# Patient Record
Sex: Female | Born: 1965
Health system: Southern US, Community
[De-identification: ages and names within clinical notes are randomized; demographics above are authoritative.]

## PROBLEM LIST (undated history)

## (undated) DIAGNOSIS — G43909 Migraine, unspecified, not intractable, without status migrainosus: Secondary | ICD-10-CM

## (undated) DIAGNOSIS — I10 Essential (primary) hypertension: Secondary | ICD-10-CM

## (undated) DIAGNOSIS — R61 Generalized hyperhidrosis: Secondary | ICD-10-CM

## (undated) DIAGNOSIS — K219 Gastro-esophageal reflux disease without esophagitis: Secondary | ICD-10-CM

## (undated) DIAGNOSIS — E785 Hyperlipidemia, unspecified: Secondary | ICD-10-CM

## (undated) DIAGNOSIS — F3281 Premenstrual dysphoric disorder: Secondary | ICD-10-CM

## (undated) DIAGNOSIS — N390 Urinary tract infection, site not specified: Secondary | ICD-10-CM

## (undated) HISTORY — DX: Urinary tract infection, site not specified: N39.0

## (undated) HISTORY — DX: Premenstrual dysphoric disorder: F32.81

## (undated) HISTORY — DX: Hyperlipidemia, unspecified: E78.5

## (undated) HISTORY — DX: Generalized hyperhidrosis: R61

## (undated) HISTORY — DX: Migraine, unspecified, not intractable, without status migrainosus: G43.909

## (undated) HISTORY — DX: Essential (primary) hypertension: I10

## (undated) HISTORY — DX: Gastro-esophageal reflux disease without esophagitis: K21.9

---

## 2002-02-13 HISTORY — PX: TUBAL LIGATION: SHX77

## 2013-10-09 LAB — LIPID PANEL
Cholesterol: 193 mg/dL (ref 0–200)
HDL: 56 mg/dL (ref 35–70)
LDL Cholesterol: 111 mg/dL
Triglycerides: 130 mg/dL (ref 40–160)

## 2013-10-09 LAB — TSH: TSH: 1.83 u[IU]/mL (ref <=5.90)

## 2013-11-12 LAB — HM PAP SMEAR: HM Pap smear: NEGATIVE

## 2014-07-21 ENCOUNTER — Other Ambulatory Visit: Payer: Self-pay | Admitting: Obstetrics and Gynecology

## 2014-07-21 ENCOUNTER — Other Ambulatory Visit (INDEPENDENT_AMBULATORY_CARE_PROVIDER_SITE_OTHER): Payer: BLUE CROSS/BLUE SHIELD | Admitting: *Deleted

## 2014-07-21 VITALS — BP 129/77 | HR 75 | Ht 60.0 in | Wt 143.4 lb

## 2014-07-21 DIAGNOSIS — R319 Hematuria, unspecified: Secondary | ICD-10-CM

## 2014-07-21 DIAGNOSIS — N390 Urinary tract infection, site not specified: Secondary | ICD-10-CM

## 2014-07-21 LAB — POCT URINALYSIS DIPSTICK
Bilirubin, UA: NEGATIVE
Glucose, UA: NEGATIVE
Ketones, UA: NEGATIVE
Nitrite, UA: POSITIVE
Protein, UA: NEGATIVE
Spec Grav, UA: <=1.005
Urobilinogen, UA: 0.2
pH, UA: 6.5

## 2014-07-21 MED ORDER — UROGESIC-BLUE 81.6 MG PO TABS: 1.0000 | ORAL_TABLET | Freq: Four times a day (QID) | ORAL | Status: DC | PRN

## 2014-07-21 MED ORDER — NITROFURANTOIN MONOHYD MACRO 100 MG PO CAPS: 100.0000 mg | ORAL_CAPSULE | Freq: Two times a day (BID) | ORAL | Status: DC

## 2014-07-23 LAB — URINE CULTURE

## 2014-07-23 NOTE — Progress Notes (Signed)
Quick Note:  Please let her know her urine grew E coli- to finish all antibiotics and let us know if she isn't feeling better ______

## 2014-11-11 ENCOUNTER — Encounter: Payer: Self-pay | Admitting: *Deleted

## 2014-11-17 ENCOUNTER — Encounter: Payer: BLUE CROSS/BLUE SHIELD | Admitting: Obstetrics and Gynecology

## 2014-12-07 ENCOUNTER — Encounter (HOSPITAL_COMMUNITY): Payer: Self-pay | Admitting: Cardiology

## 2014-12-07 ENCOUNTER — Emergency Department (HOSPITAL_COMMUNITY): Payer: BLUE CROSS/BLUE SHIELD

## 2014-12-07 ENCOUNTER — Emergency Department (HOSPITAL_COMMUNITY)
Admission: EM | Admit: 2014-12-07 | Discharge: 2014-12-07 | Disposition: A | Discharge status: Home / Self Care | Payer: BLUE CROSS/BLUE SHIELD | Attending: Emergency Medicine | Admitting: Emergency Medicine

## 2014-12-07 DIAGNOSIS — Z3202 Encounter for pregnancy test, result negative: Secondary | ICD-10-CM | POA: Diagnosis not present

## 2014-12-07 DIAGNOSIS — R109 Unspecified abdominal pain: Secondary | ICD-10-CM

## 2014-12-07 DIAGNOSIS — N200 Calculus of kidney: Secondary | ICD-10-CM

## 2014-12-07 DIAGNOSIS — Z87440 Personal history of urinary (tract) infections: Secondary | ICD-10-CM | POA: Diagnosis not present

## 2014-12-07 DIAGNOSIS — Z8659 Personal history of other mental and behavioral disorders: Secondary | ICD-10-CM | POA: Diagnosis not present

## 2014-12-07 LAB — URINE MICROSCOPIC-ADD ON

## 2014-12-07 LAB — CBC
HCT: 40.9 % (ref 36.0–46.0)
Hemoglobin: 13.4 g/dL (ref 12.0–15.0)
MCH: 30.2 pg (ref 26.0–34.0)
MCHC: 32.8 g/dL (ref 30.0–36.0)
MCV: 92.1 fL (ref 78.0–100.0)
Platelets: 314 10*3/uL (ref 150–400)
RBC: 4.44 MIL/uL (ref 3.87–5.11)
RDW: 12.6 % (ref 11.5–15.5)
WBC: 7.4 10*3/uL (ref 4.0–10.5)

## 2014-12-07 LAB — COMPREHENSIVE METABOLIC PANEL
ALT: 16 U/L (ref 14–54)
AST: 16 U/L (ref 15–41)
Albumin: 4 g/dL (ref 3.5–5.0)
Alkaline Phosphatase: 46 U/L (ref 38–126)
Anion gap: 7 (ref 5–15)
BUN: 9 mg/dL (ref 6–20)
CO2: 25 mmol/L (ref 22–32)
Calcium: 9.6 mg/dL (ref 8.9–10.3)
Chloride: 108 mmol/L (ref 101–111)
Creatinine, Ser: 0.73 mg/dL (ref 0.44–1.00)
GFR calc Af Amer: >60 mL/min (ref >60)
GFR calc non Af Amer: >60 mL/min (ref >60)
Glucose, Bld: 117 mg/dL — ABNORMAL HIGH (ref 65–99)
Potassium: 4.1 mmol/L (ref 3.5–5.1)
Sodium: 140 mmol/L (ref 135–145)
Total Bilirubin: 0.6 mg/dL (ref 0.3–1.2)
Total Protein: 6.8 g/dL (ref 6.5–8.1)

## 2014-12-07 LAB — URINALYSIS, ROUTINE W REFLEX MICROSCOPIC
Bilirubin Urine: NEGATIVE
Glucose, UA: NEGATIVE mg/dL
Ketones, ur: NEGATIVE mg/dL
Leukocytes, UA: NEGATIVE
Nitrite: NEGATIVE
Protein, ur: NEGATIVE mg/dL
Specific Gravity, Urine: 1.021 (ref 1.005–1.030)
Urobilinogen, UA: 0.2 mg/dL (ref 0.0–1.0)
pH: 7 (ref 5.0–8.0)

## 2014-12-07 LAB — POC URINE PREG, ED: Preg Test, Ur: NEGATIVE

## 2014-12-07 LAB — LIPASE, BLOOD: Lipase: 20 U/L (ref 11–51)

## 2014-12-07 MED ORDER — ONDANSETRON 4 MG PO TBDP
4.0000 mg | ORAL_TABLET | Freq: Once | ORAL | Status: AC
  Administered 2014-12-07: 4 mg via ORAL
  Filled 2014-12-07: qty 1

## 2014-12-07 MED ORDER — ONDANSETRON 4 MG PO TBDP: 4.0000 mg | ORAL_TABLET | Freq: Three times a day (TID) | ORAL | Status: DC | PRN

## 2014-12-07 MED ORDER — OXYCODONE-ACETAMINOPHEN 5-325 MG PO TABS: 2.0000 | ORAL_TABLET | Freq: Once | ORAL | Status: DC

## 2014-12-07 MED ORDER — IBUPROFEN 800 MG PO TABS: 800.0000 mg | ORAL_TABLET | Freq: Three times a day (TID) | ORAL | Status: DC

## 2014-12-07 MED ORDER — KETOROLAC TROMETHAMINE 30 MG/ML IJ SOLN
30.0000 mg | Freq: Once | INTRAMUSCULAR | Status: AC
  Administered 2014-12-07: 30 mg via INTRAVENOUS
  Filled 2014-12-07: qty 1

## 2014-12-07 NOTE — Discharge Instructions (Signed)
1. Medications: ibuprofen, zofran, usual home medications 2. Treatment: rest, drink plenty of fluids 3. Follow Up: please followup with your primary doctor this week for discussion of your diagnoses and further evaluation after today's visit; if you do not have a primary care doctor use the resource guide provided to find one; please return to the ER for high fever, severe pain, persistent vomiting, new or worsening symptoms   Flank Pain Flank pain refers to pain that is located on the side of the body between the upper abdomen and the back. The pain may occur over a short period of time (acute) or may be long-term or reoccurring (chronic). It may be mild or severe. Flank pain can be caused by many things. CAUSES  Some of the more common causes of flank pain include:  Muscle strains.   Muscle spasms.   A disease of your spine (vertebral disk disease).   A lung infection (pneumonia).   Fluid around your lungs (pulmonary edema).   A kidney infection.   Kidney stones.   A very painful skin rash caused by the chickenpox virus (shingles).   Gallbladder disease.  [place] care will depend on the cause of your pain. In general,  Rest as directed by your caregiver.  Drink enough fluids to keep your urine clear or pale yellow.  Only take over-the-counter or prescription medicines as directed by your caregiver. Some medicines may help relieve the pain.  Tell your caregiver about any changes in your pain.  Follow up with your caregiver as directed. SEEK IMMEDIATE MEDICAL CARE IF:   Your pain is not controlled with medicine.   You have new or worsening symptoms.  Your pain increases.   You have abdominal pain.   You have shortness of breath.   You have persistent nausea or vomiting.   You have swelling in your abdomen.   You feel faint or pass out.   You have blood in your urine.  You have a fever or persistent symptoms for more than  2-3 days.  You have a fever and your symptoms suddenly get worse. MAKE SURE YOU:   Understand these instructions.  Will watch your condition.  Will get help right away if you are not doing well or get worse.   This information is not intended to replace advice given to you by your health care provider. Make sure you discuss any questions you have with your health care provider.   Document Released: 03/23/2005 Document Revised: 10/25/2011 Document Reviewed: 09/14/2011 Elsevier Interactive Patient Education 2016 [place].  Kidney Stones Kidney stones (urolithiasis) are deposits that form inside your kidneys. The intense pain is caused by the stone moving through the urinary tract. When the stone moves, the ureter goes into spasm around the stone. The stone is usually passed in the urine.  CAUSES   A disorder that makes certain neck glands produce too much parathyroid hormone (primary hyperparathyroidism).  A buildup of uric acid crystals, similar to gout in your joints.  Narrowing (stricture) of the ureter.  A kidney obstruction present at birth (congenital obstruction).  Previous surgery on the kidney or ureters.  Numerous kidney infections. SYMPTOMS   Feeling sick to your stomach (nauseous).  Throwing up (vomiting).  Blood in the urine (hematuria).  Pain that usually spreads (radiates) to the groin.  Frequency or urgency of urination. DIAGNOSIS   Taking a history and physical exam.  Blood or urine tests.  CT scan.  Occasionally, an examination  of the inside of the urinary bladder (cystoscopy) is performed. TREATMENT   Observation.  Increasing your fluid intake.  Extracorporeal shock wave lithotripsy--This is a noninvasive procedure that uses shock waves to break up kidney stones.  Surgery may be needed if you have severe pain or persistent obstruction. There are various surgical procedures. Most of the procedures are performed with the use of small  instruments. Only small incisions are needed to accommodate these instruments, so recovery time is minimized. The size, location, and chemical composition are all important variables that will determine the proper choice of action for you. Talk to your health care provider to better understand your situation so that you will minimize the risk of injury to yourself and your kidney.  HOME CARE INSTRUCTIONS   Drink enough water and fluids to keep your urine clear or pale yellow. This will help you to pass the stone or stone fragments.  Strain all urine through the provided strainer. Keep all particulate matter and stones for your health care provider to see. The stone causing the pain may be as small as a grain of salt. It is very important to use the strainer each and every time you pass your urine. The collection of your stone will allow your health care provider to analyze it and verify that a stone has actually passed. The stone analysis will often identify what you can do to reduce the incidence of recurrences.  Only take over-the-counter or prescription medicines for pain, discomfort, or fever as directed by your health care provider.  Keep all follow-up visits as told by your health care provider. This is important.  Get follow-up X-rays if required. The absence of pain does not always mean that the stone has passed. It may have only stopped moving. If the urine remains completely obstructed, it can cause loss of kidney function or even complete destruction of the kidney. It is your responsibility to make sure X-rays and follow-ups are completed. Ultrasounds of the kidney can show blockages and the status of the kidney. Ultrasounds are not associated with any radiation and can be performed easily in a matter of minutes.  Make changes to your daily diet as told by your health care provider. You may be told to:  Limit the amount of salt that you eat.  Eat 5 or more servings of fruits and  vegetables each day.  Limit the amount of meat, poultry, fish, and eggs that you eat.  Collect a 24-hour urine sample as told by your health care provider.You may need to collect another urine sample every 6-12 months. SEEK MEDICAL CARE IF:  You experience pain that is progressive and unresponsive to any pain medicine you have been prescribed. SEEK IMMEDIATE MEDICAL CARE IF:   Pain cannot be controlled with the prescribed medicine.  You have a fever or shaking chills.  The severity or intensity of pain increases over 18 hours and is not relieved by pain medicine.  You develop a new onset of abdominal pain.  You feel faint or pass out.  You are unable to urinate.   This information is not intended to replace advice given to you by your health care provider. Make sure you discuss any questions you have with your health care provider.   Document Released: 01/30/2005 Document Revised: 10/21/2014 Document Reviewed: 07/03/2012 Elsevier Interactive Patient Education 2016 [employer].   Emergency Department Resource Guide 1) Find a Doctor and Pay Out of Pocket Although you won't have to find out who  is covered by your insurance plan, it is a good idea to ask around and get recommendations. You will then need to call the office and see if the doctor you have chosen will accept you as a new patient and what types of options they offer for patients who are self-pay. Some doctors offer discounts or will set up payment plans for their patients who do not have insurance, but you will need to ask so you aren't surprised when you get to your appointment.  2) Contact Your Local Health Department Not all health departments have doctors that can see patients for sick visits, but many do, so it is worth a call to see if yours does. If you don't know where your local health department is, you can check in your phone book. The CDC also has a tool to help you locate your state's health department, and  many state websites also have listings of all of their local health departments.  3) Find a [facility] If your illness is not likely to be very severe or complicated, you may want to try a walk in clinic. These are popping up all over the country in pharmacies, drugstores, and shopping centers. They're usually staffed by nurse practitioners or physician assistants that have been trained to treat common illnesses and complaints. They're usually fairly quick and inexpensive. However, if you have serious medical issues or chronic medical problems, these are probably not your best option.  No Primary Care Doctor: - Call Health Connect at  [phone] - they can help you locate a primary care doctor that  accepts your insurance, provides certain services, etc. - Physician Referral Service- [phone]  Chronic Pain Problems: Organization         Address  Phone   Notes  [facility]  [phone] Patients need to be referred by their primary care doctor.   Medication Assistance: Organization         Address  Phone   Notes  [facility] Medication [facility] [address] [place], [place] [address] [phone] --Must be a resident of [place] -- Must have NO insurance coverage whatsoever (no Medicaid/ Medicare, etc.) -- The pt. MUST have a primary care doctor that directs their care regularly and follows them in the community   MedAssist  [phone]   [employer]  [phone]    Agencies that provide inexpensive medical care: Organization         Address  Phone   Notes  [place]  [phone]   [facility]    [phone]   [facility] [place], [place] [address] [phone]   [place] [address] [place] [phone]   Planned Parenthood    [phone]   [facility]    [phone]   [place]  and [place] [address], [place] Phone:  [phone], Fax:  [phone] Hours of Operation:  9 am - 6 pm, M-F.  Also accepts Medicaid/Medicare and self-pay.  [facility] for [place], [address], [place] Phone: [phone], Fax: [phone]. Hours of Operation:  8:30 am - 5:30 pm, M-F.  Also accepts Medicaid and self-pay.  [facility] [place] [address] [place] Phone: [phone]   [place], [place], [place] [phone],  Ext. [phone] Mondays & Thursdays: 7-9 AM.  First 15 patients are seen on a first come, first serve basis.    [place] Providers:  Organization         Address  Phone   Notes  [facility] [address], [place] [phone] Also accepts self-pay patients.  [facility] [facility] [address], [place]  [phone]   [place], [address], [place] [phone]   [facility] [address], [place] [phone]   Lucianne Lei [address], [place]   [phone] Only accepts [place] Access [place] patients after they have their name applied to their card.   Self-Pay (no insurance) in [facility]:  Organization         Address  Phone   Notes  Sickle Cell Patients, [facility] [place] [phone]   [facility] [place] [phone]   [facility] [place]  [place], [place], [place] [phone]   [facility]/Dr. Osei-Bonsu  [address], [address], [place] [phone] Phone number for both [place] and [place] locations is the same.  Urgent Medical and [facility] [address], [place] [phone]   [place], [place] or [address] [phone]   [facility] [address], [place] [phone], phone; [phone], fax Sees patients 1st and 3rd Saturday of every month.  Must not qualify for public or private insurance (i.e. Medicaid, Medicare, [place] Health Choice, Veterans' Benefits)  Household income should be no more than 200% of the poverty level The clinic cannot treat you if you are pregnant or think you are pregnant  Sexually transmitted diseases are not treated at the clinic.    Dental Care: Organization         Address  Phone  Notes  [facility] of [facility] [place] [phone] Accepts children up to age 23 who are enrolled in [place] or [place]; pregnant women with a Medicaid card; and children who have applied for Medicaid or [place] Health Choice, but were declined, whose parents can pay a reduced fee at time of service.  [facility] Department of [facility]  [address], [place] [phone] Accepts children up to age 31 who are enrolled in [place] or [place]; pregnant women with a Medicaid card; and children who have applied for Medicaid or [place] Health Choice, but were declined, whose parents can pay a reduced fee at time of service.  [place] Adult Dental Access PROGRAM  [place] [phone] Patients are seen by appointment only. Walk-ins are not accepted. [place] will see patients 45 years of age and older. Monday - Tuesday (8am-5pm) Most Wednesdays (8:30-5pm) $30 per visit, cash only  [facility] Adult Dental Access PROGRAM  [address], [facility] [phone] Patients are seen by appointment only. Walk-ins are not accepted. [place] will see patients 66 years of age and older. One Wednesday Evening (Monthly: Volunteer Based).  $30 per visit, cash only   [place]  [phone]  for adults; Children under age 59, call Graduate Pediatric Dentistry at [phone]. Children aged 52-14, please call [phone] to request a pediatric application.  Dental services are provided in all areas of dental care including fillings, crowns and bridges, complete and partial dentures, implants, gum treatment, root canals, and extractions. Preventive care is also provided. Treatment is provided to both adults and children. Patients are selected via a lottery and there is often a waiting list.   [facility] [address], [place]  [phone] [email or web address]   [facility] [address] [place], [place] [phone], Ext. [phone] Second and Fourth Thursday of each month, opens at 6:30 AM; Clinic ends at 9 AM.  Patients are seen on a first-come first-served basis, and a limited number are seen during each clinic.   [facility]  [address] Hillard Danker [place], [place] [phone]   Eligibility Requirements You must have lived in [place], [place], or [place] counties for at least the last three months.   You cannot be eligible for state or federal sponsored [employer], including [employer], [place], or [employer].   You generally cannot be eligible for healthcare insurance through your employer.    How to apply: Eligibility screenings are held every Tuesday and Wednesday afternoon from 1:00 pm until 4:00 pm. You do not need an appointment for the interview!  [facility] [address], [place], [place]   [place]  [place] Department  [place]  [phone]    Behavioral Health Resources in the Community: Intensive Outpatient Programs Organization         Address  Phone  Notes  [place]helsea Cartwright. [address] [place],  [place] [phone]   [facility] Outpatient [address] [place], [place]   ADS: Alcohol & Drug S[facility]cs [address] [place], [place]   [place] [address]  [place], [place] or [phone]   Substance Abuse Resources Organization         Address  Phone  Notes  Alcohol and Drug Services  [phone]   [place]  [phone]   [facility] [place]   Chinita Pester  [phone]   Residential & Outpatient Substance Abuse Program  [phone]   Psychological Services Organization         Address  Phone  Notes  [facility]  [place]  [phone]   [place] [address], [place] or [phone]    Mobile Crisis Teams Organization         Address  Phone  Notes  [facility], Mobile Crisis Care Unit  [phone]   [facility] Psychotherapeutic Services  [address] [place], [place]   [place] Levels [address], [place] [phone]    Self-Help/Support Groups Organization         Address  Phone             Notes  [place]. of [place] - variety of support groups  [place] Call for more information  Narcotics Anonymous (NA), [facility] [address], [employer]   2 meetings at this location   Brewing technologist  Notes  ASAP  Residential Treatment [address]    [place]  [phone]   [facility]  [address] [place] [address]24825, [place], [place]   [place], [place] [phone] Admissions: 8am-3pm M-F  Incentives Substance [place] [address]    [place], [place] [phone]   [facility] [address] [place], [place], [place]   [facility] [address],  [place], [place]   [facility] Programs - Intensive Outpatient [address] Kristeen Mans [address], [place], [place]   [facility] ([place].) [place].,  [place], [place] [phone] or [phone]   [facility] Services (RTS) [address], [place], [place] Accepts Medicaid  [facility] [place] [address]  [place] [phone] Substance Abuse/Addiction Treatment   [facility] Organization         Address  Phone  Notes  [facility]  [phone]   Domenic Schwab, PhD [address] [place], [place]   [phone] or [phone]   [place], [place] [phone]   [facility] [address] [place], [place] [phone] Insurance/Medicaid/sponsorship through [facility] and Families [address] [place]                                    [place], [place] [phone] [place] [address], [place], [place] [phone]    Dr. Adele Schilder  [phone]   [facility] Dept. 1) [address]  2) [place] 3)  [place] [address], [place] [phone]  [phone]   [place] [phone] or [phone] (After Hours)

## 2014-12-07 NOTE — ED Notes (Signed)
Reports right sided flank pain that started a couple of days ago. Hx of kidney stones, denies any urinary symptoms. Has nausea

## 2014-12-07 NOTE — ED Provider Notes (Signed)
CSN: [id number]     Arrival date & time 12/07/14  1006 History   First MD Initiated Contact with Patient 12/07/14 1129     Chief Complaint  Patient presents with  . Flank Pain    HPI   Rachel Cabrera is a 49 y.o. female with a PMH of nephrolithiasis who presents to the ED with right-sided flank pain. She states she has had intermittent pain for the past month, but that her pain has progressively worsened over the last several days. She reports constant right-sided flank pain radiating to her abdomen. She characterizes her pain as "throbbing." She denies exacerbating factors. She has tried ibuprofen with some symptom relief. She reports nausea. She denies fever, chills, chest pain, shortness of breath, vomiting, diarrhea, constipation, dysuria, urgency, frequency, hematuria.   Past Medical History  Diagnosis Date  . UTI (lower urinary tract infection)   . Night sweat   . PMDD (premenstrual dysphoric disorder)    Past Surgical History  Procedure Laterality Date  . Tubal ligation  2004  . Cesarean section  1989, 1992   Family History  Problem Relation Age of Onset  . Diabetes Father    Social History  Substance Use Topics  . Smoking status: Never Smoker   . Smokeless tobacco: Never Used  . Alcohol Use: Yes     Comment: occas   OB History    Gravida Para Term Preterm AB TAB SAB Ectopic Multiple Living   2               Review of Systems  Constitutional: Negative for fever and chills.  Respiratory: Negative for shortness of breath.   Cardiovascular: Negative for chest pain.  Gastrointestinal: Positive for nausea and abdominal pain. Negative for vomiting, diarrhea and constipation.  Genitourinary: Positive for flank pain. Negative for dysuria, urgency and hematuria.  Neurological: Negative for dizziness, light-headedness and headaches.  All other systems reviewed and are negative.     Allergies  Review of patient's allergies indicates no known allergies.  Home  Medications   Prior to Admission medications   Medication Sig Start Date End Date Taking? Authorizing Provider  ibuprofen (ADVIL,MOTRIN) 800 MG tablet Take 1 tablet (800 mg total) by mouth 3 (three) times daily. 12/07/14   Marella Chimes, PA-C  Methen-Hyosc-Meth Blue-Na Phos (UROGESIC-BLUE) 81.6 MG TABS Take 1 tablet (81.6 mg total) by mouth 4 (four) times daily as needed. Patient not taking: Reported on 12/07/2014 07/21/14   Melody Valene Bors, CNM  nitrofurantoin, macrocrystal-monohydrate, (MACROBID) 100 MG capsule Take 1 capsule (100 mg total) by mouth 2 (two) times daily. Patient not taking: Reported on 12/07/2014 07/21/14   Melody Valene Bors, CNM  ondansetron (ZOFRAN ODT) 4 MG disintegrating tablet Take 1 tablet (4 mg total) by mouth every 8 (eight) hours as needed for nausea. 12/07/14   Marella Chimes, PA-C    BP 118/79 mmHg  Pulse 69  Temp(Src) 98.2 F (36.8 C) (Oral)  Resp 15  Ht 5' (1.524 m)  Wt 146 lb (66.225 kg)  BMI 28.51 kg/m2  SpO2 100% Physical Exam  Constitutional: She is oriented to person, place, and time. She appears well-developed and well-nourished. No distress.  HENT:  Head: Normocephalic and atraumatic.  Right Ear: External ear normal.  Left Ear: External ear normal.  Nose: Nose normal.  Mouth/Throat: Uvula is midline, oropharynx is clear and moist and mucous membranes are normal.  Eyes: Conjunctivae, EOM and lids are normal. Pupils are equal, round, and reactive to  light. Right eye exhibits no discharge. Left eye exhibits no discharge. No scleral icterus.  Neck: Normal range of motion. Neck supple.  Cardiovascular: Normal rate, regular rhythm, normal heart sounds, intact distal pulses and normal pulses.   Pulmonary/Chest: Effort normal and breath sounds normal. No respiratory distress. She has no wheezes. She has no rales.  Abdominal: Soft. Normal appearance and bowel sounds are normal. She exhibits no distension and no mass. There is no tenderness. There is no  rigidity, no rebound, no guarding and no CVA tenderness.  Musculoskeletal: Normal range of motion. She exhibits no edema or tenderness.  Neurological: She is alert and oriented to person, place, and time.  Skin: Skin is warm, dry and intact. No rash noted. She is not diaphoretic. No erythema. No pallor.  Psychiatric: She has a normal mood and affect. Her speech is normal and behavior is normal.  Nursing note and vitals reviewed.   ED Course  Procedures (including critical care time)  Labs Review Labs Reviewed  COMPREHENSIVE METABOLIC PANEL - Abnormal; Notable for the following:    Glucose, Bld 117 (*)    All other components within normal limits  URINALYSIS, ROUTINE W REFLEX MICROSCOPIC (NOT AT [facility]) - Abnormal; Notable for the following:    Hgb urine dipstick TRACE (*)    All other components within normal limits  URINE MICROSCOPIC-ADD ON - Abnormal; Notable for the following:    Squamous Epithelial / LPF FEW (*)    Bacteria, UA FEW (*)    All other components within normal limits  LIPASE, BLOOD  CBC  POC URINE PREG, ED    Imaging Review Ct Renal Stone Study  12/07/2014  CLINICAL DATA:  Right flank pain for 3 days EXAM: CT ABDOMEN AND PELVIS WITHOUT CONTRAST TECHNIQUE: Multidetector CT imaging of the abdomen and pelvis was performed following the standard protocol without oral or intravenous contrast material administration. COMPARISON:  None. FINDINGS: Lower chest:  Lung bases are clear. Hepatobiliary: No focal liver lesions are identified on this noncontrast enhanced study. Gallbladder wall is not appreciably thickened. There is no biliary duct dilatation. Pancreas: No mass or inflammatory focus. Spleen: No focal splenic lesions identified. Adrenals/Urinary Tract: There is a 3.6 x 3.2 cm mass arising from the left adrenal which has attenuation values in the cystic range. This finding is consistent with a benign adrenal adenoma. Right adrenal appears normal. There is a 2 mm  nonobstructing calculus in the lower pole the right kidney. No intrarenal calculi are identified on the left. There is no renal mass or hydronephrosis on either side. There is no ureteral calculus on either side. Urinary bladder is midline with wall thickness within normal limits. Stomach/Bowel: There is no bowel wall or mesenteric thickening. No bowel obstruction. No free air or portal venous air. Vascular/Lymphatic: There is no demonstrable abdominal aortic aneurysm. No appreciable vascular lesion is identified this noncontrast enhanced study. No adenopathy in the abdomen or pelvis is appreciable. Reproductive: The uterus is anteverted. No pelvic masses appreciable. A small amount of free fluid is noted anterior and to the left of the rectum. Other: Appendix appears normal. No abscess is seen in the abdomen or pelvis. There is a minimal ventral hernia containing only fat. Musculoskeletal: There are no blastic or lytic bone lesions. No intramuscular lesion or abdominal wall lesion. IMPRESSION: Small amount of free fluid in the dependent portion of the pelvis. Question recent ovarian cyst rupture. Nonobstructing calculus measuring 2 mm lower pole right kidney. No hydronephrosis on either side.  No ureteral calculus on either side. Benign-appearing mass arising from the left adrenal, probably a benign adenoma. Minimal ventral hernia containing only fat. Appendix appears normal. No abscess. No bowel obstruction. Electronically Signed   By: Lowella Grip III M.D.   On: 12/07/2014 13:45     I have personally reviewed and evaluated these images and lab results as part of my medical decision-making.   EKG Interpretation None      MDM   Final diagnoses:  Right flank pain  Nephrolithiasis    49 year old female presents with right-sided flank pain radiating to her abdomen, which started several days ago and has been constant. She reports associated nausea. She denies fever, chills, chest pain, shortness  of breath, vomiting, diarrhea, constipation, dysuria, urgency, frequency, hematuria. Patient is afebrile. Vital signs stable. Heart regular rate and rhythm. Lungs clear to auscultation bilaterally. Abdomen soft, non-tender, non-distended. No rebound, guarding, or masses. No CVA tenderness.  CBC negative for leukocytosis or anemia. CMP unremarkable. Lipase within normal limits. Urinalysis with trace hemoglobin, negative for infection. Urine pregnancy negative. Will obtain CT renal stone study given concern for nephrolithiasis.  Patient given toradol for pain, zofran for nausea.  CT reveals small amount of free fluid in the dependent portion of pelvis, question recent ovarian cyst rupture, non-obstructing calculus measuring 2 mm lower pole right kidney, no hydronephrosis, no ureteral calculus, benign-appearing mass arising from left adrenal, likely benign adenoma. Discussed findings with patient. She is well appearing and reports significant symptomat improvement. Feel she is stable for discharge at this time. Will discharge with ibuprofen for pain and zofran for nausea. Will give urine strainer. Patient to follow up with PCP, resource list given. Return precautions discussed. Patient verbalizes her understanding and is in agreement with plan.  BP 118/79 mmHg  Pulse 69  Temp(Src) 98.2 F (36.8 C) (Oral)  Resp 15  Ht 5' (1.524 m)  Wt 146 lb (66.225 kg)  BMI 28.51 kg/m2  SpO2 100%     Marella Chimes, PA-C 12/07/14 [place], MD 12/08/14 1719

## 2014-12-16 ENCOUNTER — Encounter: Payer: Self-pay | Admitting: *Deleted

## 2014-12-25 ENCOUNTER — Encounter: Payer: BLUE CROSS/BLUE SHIELD | Admitting: Obstetrics and Gynecology

## 2015-12-15 ENCOUNTER — Emergency Department: Payer: BLUE CROSS/BLUE SHIELD

## 2015-12-15 ENCOUNTER — Emergency Department
Admission: EM | Admit: 2015-12-15 | Discharge: 2015-12-15 | Disposition: A | Discharge status: Home / Self Care | Payer: BLUE CROSS/BLUE SHIELD | Attending: Emergency Medicine | Admitting: Emergency Medicine

## 2015-12-15 DIAGNOSIS — Z791 Long term (current) use of non-steroidal anti-inflammatories (NSAID): Secondary | ICD-10-CM | POA: Diagnosis not present

## 2015-12-15 DIAGNOSIS — R42 Dizziness and giddiness: Secondary | ICD-10-CM | POA: Diagnosis present

## 2015-12-15 DIAGNOSIS — G43809 Other migraine, not intractable, without status migrainosus: Secondary | ICD-10-CM | POA: Diagnosis not present

## 2015-12-15 DIAGNOSIS — R0789 Other chest pain: Secondary | ICD-10-CM | POA: Diagnosis not present

## 2015-12-15 DIAGNOSIS — G43109 Migraine with aura, not intractable, without status migrainosus: Secondary | ICD-10-CM

## 2015-12-15 LAB — BASIC METABOLIC PANEL
Anion gap: 6 (ref 5–15)
BUN: 12 mg/dL (ref 6–20)
CO2: 24 mmol/L (ref 22–32)
Calcium: 9.4 mg/dL (ref 8.9–10.3)
Chloride: 107 mmol/L (ref 101–111)
Creatinine, Ser: 0.64 mg/dL (ref 0.44–1.00)
GFR calc Af Amer: >60 mL/min (ref >60)
GFR calc non Af Amer: >60 mL/min (ref >60)
Glucose, Bld: 112 mg/dL — ABNORMAL HIGH (ref 65–99)
Potassium: 3.9 mmol/L (ref 3.5–5.1)
Sodium: 137 mmol/L (ref 135–145)

## 2015-12-15 LAB — CBC
HCT: 39 % (ref 35.0–47.0)
Hemoglobin: 13.6 g/dL (ref 12.0–16.0)
MCH: 31.4 pg (ref 26.0–34.0)
MCHC: 34.9 g/dL (ref 32.0–36.0)
MCV: 90 fL (ref 80.0–100.0)
Platelets: 288 10*3/uL (ref 150–440)
RBC: 4.34 MIL/uL (ref 3.80–5.20)
RDW: 12.8 % (ref 11.5–14.5)
WBC: 7.2 10*3/uL (ref 3.6–11.0)

## 2015-12-15 LAB — TROPONIN I
Troponin I: <0.03 ng/mL (ref <0.03)
Troponin I: <0.03 ng/mL (ref <0.03)

## 2015-12-15 MED ORDER — METOCLOPRAMIDE HCL 5 MG/ML IJ SOLN
10.0000 mg | INTRAMUSCULAR | Status: AC
  Administered 2015-12-15: 10 mg via INTRAVENOUS
  Filled 2015-12-15: qty 2

## 2015-12-15 MED ORDER — SODIUM CHLORIDE 0.9 % IV BOLUS (SEPSIS)
500.0000 mL | INTRAVENOUS | Status: AC
  Administered 2015-12-15: 500 mL via INTRAVENOUS

## 2015-12-15 MED ORDER — KETOROLAC TROMETHAMINE 30 MG/ML IJ SOLN
15.0000 mg | Freq: Once | INTRAMUSCULAR | Status: AC
  Administered 2015-12-15: 15 mg via INTRAVENOUS
  Filled 2015-12-15: qty 1

## 2015-12-15 MED ORDER — DIPHENHYDRAMINE HCL 50 MG/ML IJ SOLN
12.5000 mg | Freq: Once | INTRAMUSCULAR | Status: AC
  Administered 2015-12-15: 12.5 mg via INTRAVENOUS
  Filled 2015-12-15: qty 1

## 2015-12-15 MED ORDER — ASPIRIN 81 MG PO CHEW
324.0000 mg | CHEWABLE_TABLET | Freq: Once | ORAL | Status: AC
  Administered 2015-12-15: 324 mg via ORAL
  Filled 2015-12-15: qty 4

## 2015-12-15 NOTE — Discharge Instructions (Signed)
You have been seen in the Emergency Department (ED) today for a variety of symptoms including headache and neck pain radiating into your arm as well as chest discomfort.  As we have discussed today?s test results are normal, but you may require further testing.  Please follow up with the recommended doctor as instructed above in these documents regarding today?s emergent visit and your recent symptoms to discuss further management.  Continue to take your regular medications. If you are not doing so already, please also take a daily baby aspirin (81 mg), at least until you follow up with your doctor.  Return to the Emergency Department (ED) if you experience any further chest pain/pressure/tightness, difficulty breathing, or sudden sweating, or other symptoms that concern you.

## 2015-12-15 NOTE — ED Notes (Signed)
ED Provider at bedside. 

## 2015-12-15 NOTE — ED Triage Notes (Signed)
Pt c/o left side chest pain with sob and nausea after waking this morning.Rachel Cabrera

## 2015-12-15 NOTE — ED Provider Notes (Signed)
[facility] Emergency Department Provider Note  ____________________________________________   First MD Initiated Contact with Patient 12/15/15 1157     (approximate)  I have reviewed the triage vital signs and the nursing notes.   HISTORY  Chief Complaint Chest Pain    HPI Rachel Cabrera is a 50 y.o. female with no significant chronic medical history and who is generally healthy and active who presents for evaluation of gradual onset of a constellation of symptoms.  These include left-sided headache radiating down into her neck and then down into her left arm, as well as severe nausea without vomiting.  She states that the symptoms started gradually after she awoke and have gotten worse and are now moderate to severe.  She feels some discomfort in the left side of her chest but thinks it may be the strap on her bra.  He also feels a sensation of dizziness and earlier today felt like if she were to stand up she would fall down but that has improved.  She denies shortness of breath, any other chest pain, abdominal pain, dysuria, fever/chills.  She has not been ill recently.  She had no traumatic injury.  She still feels some throbbing pain in her head and neck.  Nothing in particular makes her symptoms better nor worse.  She denies any weakness in any of her extremities and has had no numbness or tingling in her extremities.  She does not have diabetes, hypertension, hyperlipidemia, tobacco use history.  Her biological father died from a heart attack in his mid 18s.  Past Medical History:  Diagnosis Date  . Night sweat   . PMDD (premenstrual dysphoric disorder)   . UTI (lower urinary tract infection)     Patient Active Problem List   Diagnosis Date Noted  . UTI (lower urinary tract infection) 07/21/2014    Past Surgical History:  Procedure Laterality Date  . [place]  . TUBAL LIGATION  2004    Prior to Admission medications     Medication Sig Start Date End Date Taking? Authorizing Provider  ibuprofen (ADVIL,MOTRIN) 800 MG tablet Take 1 tablet (800 mg total) by mouth 3 (three) times daily. 12/07/14   Marella Chimes, PA-C    Allergies Review of patient's allergies indicates no known allergies.  Family History  Problem Relation Age of Onset  . Diabetes Father     Social History Social History  Substance Use Topics  . Smoking status: Never Smoker  . Smokeless tobacco: Never Used  . Alcohol use Yes     Comment: occas    Review of Systems Constitutional: No fever/chills Eyes: No visual changes. ENT: No sore throat. Cardiovascular: +chest discomfort Respiratory: Denies shortness of breath. Gastrointestinal: No abdominal pain.  Nausea, no vomiting.  No diarrhea.  No constipation. Genitourinary: Negative for dysuria. Musculoskeletal: Headache radiating down the left side of her neck and into her left arm. Skin: Negative for rash. Neurological: Left-sided headache and dizziness, no focal neurological deficits  10-point ROS otherwise negative.  ____________________________________________   PHYSICAL EXAM:  VITAL SIGNS: ED Triage Vitals  Enc Vitals Group     BP 12/15/15 1040 140/81     Pulse Rate 12/15/15 1040 73     Resp 12/15/15 1040 16     Temp 12/15/15 1040 98.6 F (37 C)     Temp Source 12/15/15 1040 Oral     SpO2 12/15/15 1040 100 %     Weight 12/15/15 1036 145 lb (  65.8 kg)     Height 12/15/15 1036 5' (1.524 m)     Head Circumference --      Peak Flow --      Pain Score 12/15/15 1036 8     Pain Loc --      Pain Edu? --      Excl. in [place]? --     Constitutional: Alert and oriented. Well appearing and in no acute distress. Eyes: Conjunctivae are normal. PERRL. EOMI. No nystagmus. Head: Atraumatic. Nose: No congestion/rhinnorhea. Mouth/Throat: Mucous membranes are moist.  Oropharynx non-erythematous. Neck: No stridor.  No meningeal signs.  No bruit.  Cardiovascular: Normal  rate, regular rhythm. Good peripheral circulation. Grossly normal heart sounds. Respiratory: Normal respiratory effort.  No retractions. Lungs CTAB. Gastrointestinal: Soft and nontender. No distention.  Musculoskeletal: No lower extremity tenderness nor edema. No gross deformities of extremities. Neurologic:  Normal speech and language. No gross focal neurologic deficits are appreciated.  The patient has normal grip strength bilaterally, no sensory deficits, no dysmetria. Skin:  Skin is warm, dry and intact. No rash noted. Psychiatric: Mood and affect are normal. Speech and behavior are normal.  ____________________________________________   LABS (all labs ordered are listed, but only abnormal results are displayed)  Labs Reviewed  BASIC METABOLIC PANEL - Abnormal; Notable for the following:       Result Value   Glucose, Bld 112 (*)    All other components within normal limits  CBC  TROPONIN I  TROPONIN I   ____________________________________________  EKG  ED ECG REPORT I, Frannie Shedrick, the attending physician, personally viewed and interpreted this ECG.  Date: 12/15/2015 EKG Time: 10:40 AM Rate: 79 Rhythm: normal sinus rhythm QRS Axis: normal Intervals: normal ST/T Wave abnormalities: normal Conduction Disturbances: none Narrative Interpretation: unremarkable  ____________________________________________  RADIOLOGY   Dg Chest 2 View  Result Date: 12/15/2015 CLINICAL DATA:  Left-sided chest pain radiating into the left neck and face in into the left arm associated with shortness of breath, nausea, and dizziness. No previous cardiopulmonary abnormalities. EXAM: CHEST  2 VIEW COMPARISON:  Report of a chest x-ray of March 29, 1998. FINDINGS: The lungs are well-expanded and clear. There is no pneumothorax, pneumomediastinum, or pleural effusion. The heart and pulmonary vascularity are normal. The mediastinum is normal in width. The bony thorax exhibits no acute  abnormality. IMPRESSION: There is no active cardiopulmonary disease. Electronically Signed   By: David  Martinique M.D.   On: 12/15/2015 11:09    ____________________________________________   PROCEDURES  Procedure(s) performed:   Procedures   Critical Care performed: No ____________________________________________   INITIAL IMPRESSION / ASSESSMENT AND PLAN / ED COURSE  Pertinent labs & imaging results that were available during my care of the patient were reviewed by me and considered in my medical decision making (see chart for details).  HEART score 3, PERC negative.  Signs and symptoms may suggest an atypical migraine.  Less likely diagnoses include are not limited to subarachnoid hemorrhage, carotid dissection, posterior CVA, but these diagnoses are much less likely given a normal neurological exam and how well-appearing the patient is with normal vital signs.  I had a long discussion with her and we are going to treat her as a migraine while planning for a 2 troponin workup.  If she is feeling better and comfortable for plan for outpatient follow-up we will go that direction.  I am providing a full dose aspirin as well as treatment for migraine   Clinical Course  Comment By  Time  Reassuring initial labs.  Will reassess patient after treatment before definitively deciding upon imaging of head/neck; if symptoms improved significantly, low suspicion of vascular/intracranial abnormality makes unlikely. Hinda Kehr, MD 11/01 1252  The patient states that she is feeling much better after treatment for the migraine.  She continues to have no neurological deficits and her headache is almost completely gone as is her neck pain.  I do not feel she would benefit from imaging at this time and I did discuss with her and she agrees.  We will continue with plan for repeating a troponin at 3 PM and reassessing at that time. Hinda Kehr, MD 11/01 1327     ____________________________________________  FINAL CLINICAL IMPRESSION(S) / ED DIAGNOSES  Final diagnoses:  Complicated migraine  Atypical chest pain     MEDICATIONS GIVEN DURING THIS VISIT:  Medications  sodium chloride 0.9 % bolus 500 mL (0 mLs Intravenous Stopped 12/15/15 1430)  ketorolac (TORADOL) 30 MG/ML injection 15 mg (15 mg Intravenous Given 12/15/15 1255)  metoCLOPramide (REGLAN) injection 10 mg (10 mg Intravenous Given 12/15/15 1256)  diphenhydrAMINE (BENADRYL) injection 12.5 mg (12.5 mg Intravenous Given 12/15/15 1256)  aspirin chewable tablet 324 mg (324 mg Oral Given 12/15/15 1256)     NEW OUTPATIENT MEDICATIONS STARTED DURING THIS VISIT:  New Prescriptions   No medications on file    Modified Medications   No medications on file    Discontinued Medications   METHEN-HYOSC-METH BLUE-NA PHOS (UROGESIC-BLUE) 81.6 MG TABS    Take 1 tablet (81.6 mg total) by mouth 4 (four) times daily as needed.   NITROFURANTOIN, MACROCRYSTAL-MONOHYDRATE, (MACROBID) 100 MG CAPSULE    Take 1 capsule (100 mg total) by mouth 2 (two) times daily.   ONDANSETRON (ZOFRAN ODT) 4 MG DISINTEGRATING TABLET    Take 1 tablet (4 mg total) by mouth every 8 (eight) hours as needed for nausea.     Note:  This document was prepared using Dragon voice recognition software and may include unintentional dictation errors.    Hinda Kehr, MD 12/15/15 [phone]

## 2016-07-11 ENCOUNTER — Ambulatory Visit (INDEPENDENT_AMBULATORY_CARE_PROVIDER_SITE_OTHER): Payer: BLUE CROSS/BLUE SHIELD | Admitting: Obstetrics and Gynecology

## 2016-07-11 ENCOUNTER — Encounter: Payer: Self-pay | Admitting: Obstetrics and Gynecology

## 2016-07-11 ENCOUNTER — Other Ambulatory Visit: Payer: Self-pay | Admitting: Obstetrics and Gynecology

## 2016-07-11 DIAGNOSIS — R300 Dysuria: Secondary | ICD-10-CM

## 2016-07-11 LAB — POCT URINALYSIS DIPSTICK
Bilirubin, UA: NEGATIVE
Glucose, UA: NEGATIVE
Ketones, UA: NEGATIVE
Nitrite, UA: NEGATIVE
Protein, UA: NEGATIVE
Spec Grav, UA: 1.01 (ref 1.010–1.025)
Urobilinogen, UA: 0.2 E.U./dL
pH, UA: 7 (ref 5.0–8.0)

## 2016-07-11 MED ORDER — UROGESIC-BLUE 81.6 MG PO TABS: 1.0000 | ORAL_TABLET | Freq: Four times a day (QID) | ORAL | 1 refills | Status: DC | PRN

## 2016-07-11 MED ORDER — CEFIXIME 400 MG PO CAPS: 400.0000 mg | ORAL_CAPSULE | Freq: Every day | ORAL | 1 refills | Status: DC

## 2016-07-11 NOTE — Progress Notes (Signed)
Dropped off urine due to urinary frequency and dysuria.  Will call with results.  Ranbir Chew Stedman, CNM

## 2016-07-13 ENCOUNTER — Other Ambulatory Visit: Payer: Self-pay | Admitting: Obstetrics and Gynecology

## 2016-07-13 MED ORDER — FLUCONAZOLE 150 MG PO TABS: 150.0000 mg | ORAL_TABLET | Freq: Once | ORAL | 3 refills | Status: AC

## 2016-07-14 LAB — URINE CULTURE

## 2016-07-31 ENCOUNTER — Other Ambulatory Visit: Payer: Self-pay | Admitting: *Deleted

## 2016-07-31 MED ORDER — TERCONAZOLE 0.4 % VA CREA: 1.0000 | TOPICAL_CREAM | Freq: Every day | VAGINAL | 0 refills | Status: DC

## 2016-09-14 ENCOUNTER — Encounter: Payer: Self-pay | Admitting: Obstetrics and Gynecology

## 2016-09-14 ENCOUNTER — Ambulatory Visit (INDEPENDENT_AMBULATORY_CARE_PROVIDER_SITE_OTHER): Payer: BLUE CROSS/BLUE SHIELD | Admitting: Obstetrics and Gynecology

## 2016-09-14 ENCOUNTER — Other Ambulatory Visit: Payer: Self-pay | Admitting: Obstetrics and Gynecology

## 2016-09-14 VITALS — BP 118/78 | HR 98 | Ht 60.0 in | Wt 148.0 lb

## 2016-09-14 DIAGNOSIS — F3281 Premenstrual dysphoric disorder: Secondary | ICD-10-CM

## 2016-09-14 DIAGNOSIS — Z01411 Encounter for gynecological examination (general) (routine) with abnormal findings: Secondary | ICD-10-CM

## 2016-09-14 DIAGNOSIS — N959 Unspecified menopausal and perimenopausal disorder: Secondary | ICD-10-CM | POA: Diagnosis not present

## 2016-09-14 DIAGNOSIS — N946 Dysmenorrhea, unspecified: Secondary | ICD-10-CM | POA: Diagnosis not present

## 2016-09-14 DIAGNOSIS — B373 Candidiasis of vulva and vagina: Secondary | ICD-10-CM | POA: Diagnosis not present

## 2016-09-14 DIAGNOSIS — B3731 Acute candidiasis of vulva and vagina: Secondary | ICD-10-CM

## 2016-09-14 DIAGNOSIS — Z01419 Encounter for gynecological examination (general) (routine) without abnormal findings: Secondary | ICD-10-CM | POA: Diagnosis not present

## 2016-09-14 MED ORDER — NORETHIN-ETH ESTRAD-FE BIPHAS 1 MG-10 MCG / 10 MCG PO TABS: 1.0000 | ORAL_TABLET | Freq: Every day | ORAL | 11 refills | Status: DC

## 2016-09-14 MED ORDER — TERCONAZOLE 0.4 % VA CREA: 1.0000 | TOPICAL_CREAM | Freq: Every day | VAGINAL | 0 refills | Status: DC

## 2016-09-14 NOTE — Progress Notes (Signed)
Subjective:   Rachel Cabrera is a 51 y.o. G41P0 Caucasian female here for a routine well-woman exam.  Patient's last menstrual period was 08/28/2016.    Current complaints: vaginal irritation since yeast infection treated. Menses a little lighter and shorter, and doesn't occur on time-anytime between 20 - 60 days, severe nausea and headaches and mood swings for 4 days until menses starts.   PCP: none       does desire labs  Social History: Sexual: heterosexual Marital Status: divorced Living situation: with partner / significant other Occupation: unknown occupation Tobacco/alcohol: no tobacco use Illicit drugs: no history of illicit drug use  The following portions of the patient's history were reviewed and updated as appropriate: allergies, current medications, past family history, past medical history, past social history, past surgical history and problem list.  Past Medical History Past Medical History:  Diagnosis Date  . Night sweat   . PMDD (premenstrual dysphoric disorder)   . UTI (lower urinary tract infection)     Past Surgical History Past Surgical History:  Procedure Laterality Date  . [place]  . TUBAL LIGATION  2004    Gynecologic History G2P0  Patient's last menstrual period was 08/28/2016. Contraception: tubal ligation Last Pap: 2014. Results were: normal Last mammogram: 2014. Results were: normal   Obstetric History OB History  Gravida Para Term Preterm AB Living  2            SAB TAB Ectopic Multiple Live Births               # Outcome Date GA Lbr Len/2nd Weight Sex Delivery Anes PTL Lv  2 Gravida 1992     CS-Unspec     1 Gravida 1989     CS-Unspec         Current Medications Current Outpatient Prescriptions on File Prior to Visit  Medication Sig Dispense Refill  . Cefixime (SUPRAX) 400 MG CAPS capsule Take 1 capsule (400 mg total) by mouth daily. (Patient not taking: Reported on 09/14/2016) 10 capsule 1  . ibuprofen  (ADVIL,MOTRIN) 800 MG tablet Take 1 tablet (800 mg total) by mouth 3 (three) times daily. (Patient not taking: Reported on 09/14/2016) 21 tablet 0  . Methen-Hyosc-Meth Blue-Na Phos (UROGESIC-BLUE) 81.6 MG TABS Take 1 tablet (81.6 mg total) by mouth 4 (four) times daily as needed. (Patient not taking: Reported on 09/14/2016) 30 tablet 1  . terconazole (TERAZOL 7) 0.4 % vaginal cream Place 1 applicator vaginally at bedtime. (Patient not taking: Reported on 09/14/2016) 45 g 0   No current facility-administered medications on file prior to visit.     Review of Systems Patient denies any headaches, blurred vision, shortness of breath, chest pain, abdominal pain, problems with bowel movements, urination, or intercourse.  Objective:  BP 118/78   Pulse 98   Ht 5' (1.524 m)   Wt 148 lb (67.1 kg)   LMP 08/28/2016   BMI 28.90 kg/m  Physical Exam  General:  Well developed, well nourished, no acute distress. She is alert and oriented x3. Skin:  Warm and dry Neck:  Midline trachea, no thyromegaly or nodules Cardiovascular: Regular rate and rhythm, no murmur heard Lungs:  Effort normal, all lung fields clear to auscultation bilaterally Breasts:  No dominant palpable mass, retraction, or nipple discharge Abdomen:  Soft, non tender, no hepatosplenomegaly or masses Pelvic:  External genitalia is normal in appearance.  The vagina is normal in appearance. The cervix is bulbous, no CMT.  Thin  prep pap is done with HR HPV cotesting. Uterus is felt to be normal size, shape, and contour.  No adnexal masses or tenderness noted.increased white vaginal discharge, Microscopic wet-mount exam shows negative for pathogens, normal epithelial cells, lactobacilli. Extremities:  No swelling or varicosities noted Psych:  She has a normal mood and affect  Assessment:   Healthy well-woman exam PMDD Dysmenorrhea with irregular menses Pre-menopausal bleeding Yeast infection.  Plan:  Labs obtained and will follow up  accordingly Recommend LoLoestrin for cycle control and PMDD treatment- will try-sample and Rx sent in. Reordered Terazol 7 and instructed to use x 3 nights and prn.  F/U 1 year for Ae, or sooner if needed Mammogram ordered  Melody Rockney Ghee, CNM

## 2016-09-14 NOTE — Patient Instructions (Addendum)
Place annual gynecologic exam patient instructions here.  Thank you for enrolling in [facility]. Please follow the instructions below to securely access your online medical record. MyChart allows you to send messages to your doctor, view your test results, manage appointments, and more.   How Do I Sign Up? 1. In your Internet browser, go to [employer] and enter [email or web address]. 2. Click on the Sign Up Now link in the Sign In box. You will see the New Member Sign Up page. 3. Enter your MyChart Access Code exactly as it appears below. You will not need to use this code after you've completed the sign-up process. If you do not sign up before the expiration date, you must request a new code.  MyChart Access Code: [email or web address] Expires: 11/13/2016 12:01 PM  4. Enter your Social Security Number ([email or web address]) and Date of Birth (mm/dd/yyyy) as indicated and click Submit. You will be taken to the next sign-up page. 5. Create a MyChart ID. This will be your MyChart login ID and cannot be changed, so think of one that is secure and easy to remember. 6. Create a MyChart password. You can change your password at any time. 7. Enter your Password Reset Question and Answer. This can be used at a later time if you forget your password.  8. Enter your e-mail address. You will receive e-mail notification when new information is available in [facility]. 9. Click Sign Up. You can now view your medical record.   Additional Information Remember, MyChart is NOT to be used for urgent needs. For medical emergencies, dial 911.   Menopause Menopause is the normal time of life when menstrual periods stop completely. Menopause is complete when you have missed 12 consecutive menstrual periods. It usually occurs between the ages of 11 years and 21 years. Very rarely does a woman develop menopause before the age of 4 years. At menopause, your ovaries stop producing the female hormones estrogen and  progesterone. This can cause undesirable symptoms and also affect your health. Sometimes the symptoms may occur 4-5 years before the menopause begins. There is no relationship between menopause and:  Oral contraceptives.  Number of children you had.  Race.  The age your menstrual periods started (menarche).  Heavy smokers and very thin women may develop menopause earlier in life. What are the causes?  The ovaries stop producing the female hormones estrogen and progesterone. Other causes include:  Surgery to remove both ovaries.  The ovaries stop functioning for no known reason.  Tumors of the pituitary gland in the brain.  Medical disease that affects the ovaries and hormone production.  Radiation treatment to the abdomen or pelvis.  Chemotherapy that affects the ovaries.  What are the signs or symptoms?  Hot flashes.  Night sweats.  Decrease in sex drive.  Vaginal dryness and thinning of the vagina causing painful intercourse.  Dryness of the skin and developing wrinkles.  Headaches.  Tiredness.  Irritability.  Memory problems.  Weight gain.  Bladder infections.  Hair growth of the face and chest.  Infertility. More serious symptoms include:  Loss of bone (osteoporosis) causing breaks (fractures).  Depression.  Hardening and narrowing of the arteries (atherosclerosis) causing heart attacks and strokes.  How is this diagnosed?  When the menstrual periods have stopped for 12 straight months.  Physical exam.  Hormone studies of the blood. How is this treated? There are many treatment choices and nearly as many questions about them. The decisions to treat or not to treat menopausal  changes is an individual choice made with your health care provider. Your health care provider can discuss the treatments with you. Together, you can decide which treatment will work best for you. Your treatment choices may include:  Hormone therapy (estrogen and  progesterone).  Non-hormonal medicines.  Treating the individual symptoms with medicine (for example antidepressants for depression).  Herbal medicines that may help specific symptoms.  Counseling by a psychiatrist or psychologist.  Group therapy.  Lifestyle changes including: ? Eating healthy. ? Regular exercise. ? Limiting caffeine and alcohol. ? Stress management and meditation.  No treatment.  Follow these instructions at home:  Take the medicine your health care provider gives you as directed.  Get plenty of sleep and rest.  Exercise regularly.  Eat a diet that contains calcium (good for the bones) and soy products (acts like estrogen hormone).  Avoid alcoholic beverages.  Do not smoke.  If you have hot flashes, dress in layers.  Take supplements, calcium, and vitamin D to strengthen bones.  You can use over-the-counter lubricants or moisturizers for vaginal dryness.  Group therapy is sometimes very helpful.  Acupuncture may be helpful in some cases. Contact a health care provider if:  You are not sure you are in menopause.  You are having menopausal symptoms and need advice and treatment.  You are still having menstrual periods after age 59 years.  You have pain with intercourse.  Menopause is complete (no menstrual period for 12 months) and you develop vaginal bleeding.  You need a referral to a specialist (gynecologist, psychiatrist, or psychologist) for treatment. Get help right away if:  You have severe depression.  You have excessive vaginal bleeding.  You fell and think you have a broken bone.  You have pain when you urinate.  You develop leg or chest pain.  You have a fast pounding heart beat (palpitations).  You have severe headaches.  You develop vision problems.  You feel a lump in your breast.  You have abdominal pain or severe indigestion. This information is not intended to replace advice given to you by your health care  provider. Make sure you discuss any questions you have with your health care provider. Document Released: 04/22/2003 Document Revised: 07/08/2015 Document Reviewed: 08/29/2012 Elsevier Interactive Patient Education  2017 [employer].

## 2016-09-15 ENCOUNTER — Other Ambulatory Visit: Payer: BLUE CROSS/BLUE SHIELD

## 2016-09-15 DIAGNOSIS — Z01411 Encounter for gynecological examination (general) (routine) with abnormal findings: Secondary | ICD-10-CM | POA: Diagnosis not present

## 2016-09-15 LAB — CYTOLOGY - PAP

## 2016-09-16 LAB — COMPREHENSIVE METABOLIC PANEL
ALT: 21 IU/L (ref 0–32)
AST: 15 IU/L (ref 0–40)
Albumin/Globulin Ratio: 1.7 (ref 1.2–2.2)
Albumin: 4.5 g/dL (ref 3.5–5.5)
Alkaline Phosphatase: 48 IU/L (ref 39–117)
BUN/Creatinine Ratio: 19 (ref 9–23)
BUN: 16 mg/dL (ref 6–24)
Bilirubin Total: 0.4 mg/dL (ref 0.0–1.2)
CO2: 20 mmol/L (ref 20–29)
Calcium: 9.4 mg/dL (ref 8.7–10.2)
Chloride: 102 mmol/L (ref 96–106)
Creatinine, Ser: 0.86 mg/dL (ref 0.57–1.00)
GFR calc Af Amer: 92 mL/min/{1.73_m2} (ref >59)
GFR calc non Af Amer: 80 mL/min/{1.73_m2} (ref >59)
Globulin, Total: 2.7 g/dL (ref 1.5–4.5)
Glucose: 110 mg/dL — ABNORMAL HIGH (ref 65–99)
Potassium: 4.9 mmol/L (ref 3.5–5.2)
Sodium: 136 mmol/L (ref 134–144)
Total Protein: 7.2 g/dL (ref 6.0–8.5)

## 2016-09-16 LAB — FERRITIN: Ferritin: 15 ng/mL (ref 15–150)

## 2016-09-16 LAB — CBC
Hematocrit: 42.4 % (ref 34.0–46.6)
Hemoglobin: 13.4 g/dL (ref 11.1–15.9)
MCH: 29.6 pg (ref 26.6–33.0)
MCHC: 31.6 g/dL (ref 31.5–35.7)
MCV: 94 fL (ref 79–97)
Platelets: 318 10*3/uL (ref 150–379)
RBC: 4.53 x10E6/uL (ref 3.77–5.28)
RDW: 13 % (ref 12.3–15.4)
WBC: 7 10*3/uL (ref 3.4–10.8)

## 2016-09-16 LAB — LIPID PANEL
Chol/HDL Ratio: 3.9 ratio (ref 0.0–4.4)
Cholesterol, Total: 208 mg/dL — ABNORMAL HIGH (ref 100–199)
HDL: 53 mg/dL (ref >39)
LDL Calculated: 141 mg/dL — ABNORMAL HIGH (ref 0–99)
Triglycerides: 69 mg/dL (ref 0–149)
VLDL Cholesterol Cal: 14 mg/dL (ref 5–40)

## 2016-09-16 LAB — THYROID PANEL WITH TSH
Free Thyroxine Index: 1.7 (ref 1.2–4.9)
T3 Uptake Ratio: 26 % (ref 24–39)
T4, Total: 6.6 ug/dL (ref 4.5–12.0)
TSH: 2.35 u[IU]/mL (ref 0.450–4.500)

## 2016-09-16 LAB — HEMOGLOBIN A1C
Est. average glucose Bld gHb Est-mCnc: 108 mg/dL
Hgb A1c MFr Bld: 5.4 % (ref 4.8–5.6)

## 2016-09-16 LAB — VITAMIN D 25 HYDROXY (VIT D DEFICIENCY, FRACTURES): Vit D, 25-Hydroxy: 23.5 ng/mL — ABNORMAL LOW (ref 30.0–100.0)

## 2016-09-21 ENCOUNTER — Other Ambulatory Visit: Payer: Self-pay | Admitting: Obstetrics and Gynecology

## 2016-09-21 ENCOUNTER — Telehealth: Payer: Self-pay | Admitting: *Deleted

## 2016-09-21 DIAGNOSIS — E559 Vitamin D deficiency, unspecified: Secondary | ICD-10-CM

## 2016-09-21 MED ORDER — VITAMIN D (ERGOCALCIFEROL) 1.25 MG (50000 UNIT) PO CAPS: 50000.0000 [IU] | ORAL_CAPSULE | ORAL | 1 refills | Status: DC

## 2016-09-21 NOTE — Telephone Encounter (Signed)
Mailed all info to pt 

## 2016-09-21 NOTE — Telephone Encounter (Signed)
-----   Message from Joylene Igo, [place] sent at 09/21/2016 11:12 AM EDT ----- Please let her know lab results, vit D is low, I sent in RX for weekly prescription and want to recheck in 3 months. Pap is negative, all other labs OK.

## 2017-03-13 DIAGNOSIS — L821 Other seborrheic keratosis: Secondary | ICD-10-CM | POA: Diagnosis not present

## 2017-03-13 DIAGNOSIS — L578 Other skin changes due to chronic exposure to nonionizing radiation: Secondary | ICD-10-CM | POA: Diagnosis not present

## 2017-03-13 DIAGNOSIS — D485 Neoplasm of uncertain behavior of skin: Secondary | ICD-10-CM | POA: Diagnosis not present

## 2017-03-13 DIAGNOSIS — D1801 Hemangioma of skin and subcutaneous tissue: Secondary | ICD-10-CM | POA: Diagnosis not present

## 2017-03-27 DIAGNOSIS — C44519 Basal cell carcinoma of skin of other part of trunk: Secondary | ICD-10-CM | POA: Diagnosis not present

## 2017-03-27 DIAGNOSIS — D485 Neoplasm of uncertain behavior of skin: Secondary | ICD-10-CM | POA: Diagnosis not present

## 2017-04-08 ENCOUNTER — Other Ambulatory Visit: Payer: Self-pay | Admitting: Obstetrics and Gynecology

## 2017-04-11 DIAGNOSIS — C44319 Basal cell carcinoma of skin of other parts of face: Secondary | ICD-10-CM | POA: Diagnosis not present

## 2017-04-19 DIAGNOSIS — D225 Melanocytic nevi of trunk: Secondary | ICD-10-CM | POA: Diagnosis not present

## 2017-04-19 DIAGNOSIS — D485 Neoplasm of uncertain behavior of skin: Secondary | ICD-10-CM | POA: Diagnosis not present

## 2017-08-09 ENCOUNTER — Ambulatory Visit: Payer: BLUE CROSS/BLUE SHIELD | Admitting: Certified Nurse Midwife

## 2017-08-09 VITALS — BP 134/87 | HR 83 | Ht 60.0 in | Wt 152.2 lb

## 2017-08-09 DIAGNOSIS — M545 Low back pain, unspecified: Secondary | ICD-10-CM

## 2017-08-09 DIAGNOSIS — N898 Other specified noninflammatory disorders of vagina: Secondary | ICD-10-CM

## 2017-08-09 DIAGNOSIS — R102 Pelvic and perineal pain unspecified side: Secondary | ICD-10-CM

## 2017-08-09 LAB — POCT URINALYSIS DIPSTICK
Bilirubin, UA: NEGATIVE
Glucose, UA: NEGATIVE
Ketones, UA: NEGATIVE
Leukocytes, UA: NEGATIVE
Nitrite, UA: NEGATIVE
Protein, UA: NEGATIVE
Spec Grav, UA: <=1.005 — AB (ref 1.010–1.025)
Urobilinogen, UA: 0.2 E.U./dL
pH, UA: 6 (ref 5.0–8.0)

## 2017-08-09 MED ORDER — METRONIDAZOLE 500 MG PO TABS: 500.0000 mg | ORAL_TABLET | Freq: Two times a day (BID) | ORAL | 0 refills | Status: AC

## 2017-08-09 MED ORDER — TERCONAZOLE 0.4 % VA CREA: 1.0000 | TOPICAL_CREAM | Freq: Every day | VAGINAL | 0 refills | Status: DC

## 2017-08-09 NOTE — Patient Instructions (Signed)
Vaginitis Vaginitis is an inflammation of the vagina. It can happen when the normal bacteria and yeast in the vagina grow too much. There are different types. Treatment will depend on the type you have. Follow these instructions at home:  Take all medicines as told by your doctor.  Keep your vagina area clean and dry. Avoid soap. Rinse the area with water.  Avoid washing and cleaning out the vagina (douching).  Do not use tampons or have sex (intercourse) until your treatment is done.  Wipe from front to back after going to the restroom.  Wear cotton underwear.  Avoid wearing underwear while you sleep until your vaginitis is gone.  Avoid tight pants. Avoid underwear or nylons without a cotton panel.  Take off wet clothing (such as a bathing suit) as soon as you can.  Use mild, unscented products. Avoid fabric softeners and scented: ? Feminine sprays. ? Laundry detergents. ? Tampons. ? Soaps or bubble baths.  Practice safe sex and use condoms. Get help right away if:  You have belly (abdominal) pain.  You have a fever or lasting symptoms for more than 2-3 days.  You have a fever and your symptoms suddenly get worse. This information is not intended to replace advice given to you by your health care provider. Make sure you discuss any questions you have with your health care provider. Document Released: 04/28/2008 Document Revised: 07/08/2015 Document Reviewed: 07/13/2011 Elsevier Interactive Patient Education  2017 [employer].  Metronidazole tablets or capsules What is this medicine? METRONIDAZOLE (me troe NI da zole) is an antiinfective. It is used to treat certain kinds of bacterial and protozoal infections. It will not work for colds, flu, or other viral infections. This medicine may be used for other purposes; ask your health care provider or pharmacist if you have questions. COMMON BRAND NAME(S): Flagyl What should I tell my health care provider before I take this  medicine? They need to know if you have any of these conditions: -anemia or other blood disorders -disease of the nervous system -fungal or yeast infection -if you drink alcohol containing drinks -liver disease -seizures -an unusual or allergic reaction to metronidazole, or other medicines, foods, dyes, or preservatives -pregnant or trying to get pregnant -breast-feeding How should I use this medicine? Take this medicine by mouth with a full glass of water. Follow the directions on the prescription label. Take your medicine at regular intervals. Do not take your medicine more often than directed. Take all of your medicine as directed even if you think you are better. Do not skip doses or stop your medicine early. Talk to your pediatrician regarding the use of this medicine in children. Special care may be needed. Overdosage: If you think you have taken too much of this medicine contact a poison control center or emergency room at once. NOTE: This medicine is only for you. Do not share this medicine with others. What if I miss a dose? If you miss a dose, take it as soon as you can. If it is almost time for your next dose, take only that dose. Do not take double or extra doses. What may interact with this medicine? Do not take this medicine with any of the following medications: -alcohol or any product that contains alcohol -amprenavir oral solution -cisapride -disulfiram -dofetilide -dronedarone -paclitaxel injection -pimozide -ritonavir oral solution -sertraline oral solution -sulfamethoxazole-trimethoprim injection -thioridazine -ziprasidone This medicine may also interact with the following medications: -birth control pills -cimetidine -lithium -other medicines that prolong  the QT interval (cause an abnormal heart rhythm) -phenobarbital -phenytoin -warfarin This list may not describe all possible interactions. Give your health care provider a list of all the medicines,  herbs, non-prescription drugs, or dietary supplements you use. Also tell them if you smoke, drink alcohol, or use illegal drugs. Some items may interact with your medicine. What should I watch for while using this medicine? Tell your doctor or health care professional if your symptoms do not improve or if they get worse. You may get drowsy or dizzy. Do not drive, use machinery, or do anything that needs mental alertness until you know how this medicine affects you. Do not stand or sit up quickly, especially if you are an older patient. This reduces the risk of dizzy or fainting spells. Avoid alcoholic drinks while you are taking this medicine and for three days afterward. Alcohol may make you feel dizzy, sick, or flushed. If you are being treated for a sexually transmitted disease, avoid sexual contact until you have finished your treatment. Your sexual partner may also need treatment. What side effects may I notice from receiving this medicine? Side effects that you should report to your doctor or health care professional as soon as possible: -allergic reactions like skin rash or hives, swelling of the face, lips, or tongue -confusion, clumsiness -difficulty speaking -discolored or sore mouth -dizziness -fever, infection -numbness, tingling, pain or weakness in the hands or feet -trouble passing urine or change in the amount of urine -redness, blistering, peeling or loosening of the skin, including inside the mouth -seizures -unusually weak or tired -vaginal irritation, dryness, or discharge Side effects that usually do not require medical attention (report to your doctor or health care professional if they continue or are bothersome): -diarrhea -headache -irritability -metallic taste -nausea -stomach pain or cramps -trouble sleeping This list may not describe all possible side effects. Call your doctor for medical advice about side effects. You may report side effects to FDA at  1-800-FDA-1088. Where should I keep my medicine? Keep out of the reach of children. Store at room temperature below 25 degrees C (77 degrees F). Protect from light. Keep container tightly closed. Throw away any unused medicine after the expiration date. NOTE: This sheet is a summary. It may not cover all possible information. If you have questions about this medicine, talk to your doctor, pharmacist, or health care provider.  2018 Elsevier/Gold Standard (2012-09-06 14:08:39) Terconazole vaginal cream What is this medicine? TERCONAZOLE (ter KON a zole) is an antifungal medicine. It is used to treat yeast infections of the vagina. This medicine may be used for other purposes; ask your health care provider or pharmacist if you have questions. COMMON BRAND NAME(S): Terazol 3, Terazol 7, Zazole What should I tell my health care provider before I take this medicine? They need to know if you have any of these conditions: -an unusual or allergic reaction to terconazole, other antifungals, other medicines, foods, dyes or preservatives -pregnant or trying to get pregnant -breast-feeding How should I use this medicine? This medicine is only for use in the vagina. Do not take by mouth. Wash hands before and after use. Read package directions carefully before using. Use this medicine at bedtime, unless otherwise directed by your doctor or health care professional. Screw the applicator onto the end of the tube and squeeze the tube to fill the applicator. Remove the applicator from the tube. Lie on your back. Gently insert the applicator tip high in the vagina and push the  plunger to release the cream into the vagina. Gently remove the applicator. Wash the applicator well with warm water and soap. Use at regular intervals. Do not get this medicine in your eyes. If you do, rinse out with plenty of cool tap water. Finish the full course prescribed by your doctor or health care professional even if you think your  condition is better. Do not stop using this medicine if your menstrual period starts during the time of treatment. Talk to your pediatrician regarding the use of this medicine in children. Special care may be needed. Overdosage: If you think you have taken too much of this medicine contact a poison control center or emergency room at once. NOTE: This medicine is only for you. Do not share this medicine with others. What if I miss a dose? If you miss a dose, use it as soon as you can. If it is almost time for your next dose, use only that dose. Do not use double or extra doses. What may interact with this medicine? Interactions are not expected. Do not use any other vaginal products without telling your doctor or health care professional. This list may not describe all possible interactions. Give your health care provider a list of all the medicines, herbs, non-prescription drugs, or dietary supplements you use. Also tell them if you smoke, drink alcohol, or use illegal drugs. Some items may interact with your medicine. What should I watch for while using this medicine? Tell your doctor or health care professional if your symptoms do not start to get better within a few days. It is better not to have sex until you have finished your treatment. If you have sex, your partner should use a condom during sex to help prevent transfer of the infection. Your sexual partner may also need treatment. Vaginal medicines usually will come out of the vagina during treatment. To keep the medicine from getting on your clothing, wear a mini-pad or sanitary napkin. The use of tampons is not recommended since they may soak up the medicine. To help clear up the infection, wear freshly washed cotton, not synthetic, underwear. What side effects may I notice from receiving this medicine? Side effects that you should report to your doctor or health care professional as soon as possible: -painful or difficult  urination -vaginal pain Side effects that usually do not require medical attention (report to your doctor or health care professional if they continue or are bothersome): -headache -menstrual pain -stomach upset -vaginal irritation, itching or burning This list may not describe all possible side effects. Call your doctor for medical advice about side effects. You may report side effects to FDA at 1-800-FDA-1088. Where should I keep my medicine? Keep out of the reach of children. Store at room temperature between 15 and 30 degrees C (59 and 86 degrees F). Throw away any unused medicine after the expiration date. NOTE: This sheet is a summary. It may not cover all possible information. If you have questions about this medicine, talk to your doctor, pharmacist, or health care provider.  2018 Elsevier/Gold Standard (2007-10-16 13:51:27)

## 2017-08-09 NOTE — Progress Notes (Signed)
Pt is here with c/o poss yeast infection. C/o backache, lower pelvic pain with yellowish discharge.

## 2017-08-09 NOTE — Progress Notes (Signed)
GYN ENCOUNTER NOTE  Subjective:       Rachel Cabrera is a 52 y.o. G1P0 female here for gynecologic evaluation of the following issues:  1. Pelvic pain 2. Low back pain 3. Vaginal itching 4. Vaginal discharge-white, yellow.   Reports symptoms for the last week. First noticed after intercourse. No relief with three (3) day vaginal insert and other home treatment measures.   Denies difficulty breathing or respiratory distress, chest pain, vaginal bleeding, dysuria, and leg pain or swelling.    Gynecologic History  Patient's last menstrual period was 08/28/2016.  Contraception: OCP (estrogen/progesterone) and tubal ligation  Last Pap: 09/2016. Results were: Negative/Negative  Last mammogram: due.   Obstetric History  OB History  Gravida Para Term Preterm AB Living  2            SAB TAB Ectopic Multiple Live Births               # Outcome Date GA Lbr Len/2nd Weight Sex Delivery Anes PTL Lv  2 Gravida 1992     CS-Unspec     1 Gravida 1989     CS-Unspec       Past Medical History:  Diagnosis Date  . Night sweat   . PMDD (premenstrual dysphoric disorder)   . UTI (lower urinary tract infection)     Past Surgical History:  Procedure Laterality Date  . [place]  . TUBAL LIGATION  2004    Current Outpatient Medications on File Prior to Visit  Medication Sig Dispense Refill  . Norethindrone-Ethinyl Estradiol-Fe Biphas (LO LOESTRIN FE) 1 MG-10 MCG / 10 MCG tablet Take 1 tablet by mouth daily. 1 Package 11  . Vitamin D, Ergocalciferol, (DRISDOL) 50000 units CAPS capsule Take 1 capsule (50,000 Units total) by mouth every 7 (seven) days. 30 capsule 1  . ibuprofen (ADVIL,MOTRIN) 800 MG tablet Take 1 tablet (800 mg total) by mouth 3 (three) times daily. (Patient not taking: Reported on 09/14/2016) 21 tablet 0  . Methen-Hyosc-Meth Blue-Na Phos (UROGESIC-BLUE) 81.6 MG TABS Take 1 tablet (81.6 mg total) by mouth 4 (four) times daily as needed. (Patient not taking:  Reported on 09/14/2016) 30 tablet 1  . SUPRAX 400 MG CAPS capsule TAKE 1 CAPSULE (400 MG TOTAL) BY MOUTH DAILY. (Patient not taking: Reported on 08/09/2017) 10 capsule 0   No current facility-administered medications on file prior to visit.     No Known Allergies  Social History   Socioeconomic History  . Marital status: Single    Spouse name: Not on file  . Number of children: Not on file  . Years of education: Not on file  . Highest education level: Not on file  Occupational History  . Not on file  Social Needs  . Financial resource strain: Not on file  . Food insecurity:    Worry: Not on file    Inability: Not on file  . Transportation needs:    Medical: Not on file    Non-medical: Not on file  Tobacco Use  . Smoking status: Never Smoker  . Smokeless tobacco: Never Used  Substance and Sexual Activity  . Alcohol use: Yes    Comment: occas  . Drug use: No  . Sexual activity: Yes    Birth control/protection: Surgical  Lifestyle  . Physical activity:    Days per week: Not on file    Minutes per session: Not on file  . Stress: Not on file  Relationships  . Social  connections:    Talks on phone: Not on file    Gets together: Not on file    Attends religious service: Not on file    Active member of club or organization: Not on file    Attends meetings of clubs or organizations: Not on file    Relationship status: Not on file  . Intimate partner violence:    Fear of current or ex partner: Not on file    Emotionally abused: Not on file    Physically abused: Not on file    Forced sexual activity: Not on file  Other Topics Concern  . Not on file  Social History Narrative  . Not on file    Family History  Problem Relation Age of Onset  . Diabetes Father     The following portions of the patient's history were reviewed and updated as appropriate: allergies, current medications, past family history, past medical history, past social history, past surgical history and  problem list.  Review of Systems  ROS negative except as noted above. Information obtained for patient.   Objective:   BP 134/87   Pulse 83   Ht 5' (1.524 m)   Wt 152 lb 3 oz (69 kg)   LMP 08/28/2016   BMI 29.72 kg/m   CONSTITUTIONAL: Well-developed, well-nourished female in no acute distress.   PELVIC:  External Genitalia: Normal  Vagina: White thin discharge present  Cervix: Normal  MUSCULOSKELETAL: Normal range of motion. No tenderness.  No cyanosis, clubbing, or edema.  Microscopic wet-mount exam shows KOH done, clue cells, hyphae, white blood cells.  Assessment:   1. Pelvic pain - POCT urinalysis dipstick - Urine Culture  2. Low back pain without sciatica, unspecified back pain laterality, unspecified chronicity - Urine Culture  3. Vaginal itching   4. Vaginal discharge   Plan:   Rx: Flagyl and Terazol, see orders.   Labs: Urine culture, will contact patient with results.   Reviewed red flag symptoms and when to call.   RTC as needed.    Diona Fanti, CNM [facility], [facility]

## 2017-08-11 LAB — URINE CULTURE: Organism ID, Bacteria: NO GROWTH

## 2017-08-20 ENCOUNTER — Other Ambulatory Visit: Payer: Self-pay | Admitting: Certified Nurse Midwife

## 2017-08-20 ENCOUNTER — Other Ambulatory Visit: Payer: Self-pay

## 2017-08-20 MED ORDER — TERCONAZOLE 0.4 % VA CREA: 1.0000 | TOPICAL_CREAM | Freq: Every day | VAGINAL | 0 refills | Status: DC

## 2017-08-20 MED ORDER — METRONIDAZOLE 500 MG PO TABS: 500.0000 mg | ORAL_TABLET | Freq: Two times a day (BID) | ORAL | 0 refills | Status: DC

## 2017-08-25 ENCOUNTER — Other Ambulatory Visit: Payer: Self-pay | Admitting: Obstetrics and Gynecology

## 2017-08-27 DIAGNOSIS — M5441 Lumbago with sciatica, right side: Secondary | ICD-10-CM | POA: Diagnosis not present

## 2017-08-27 DIAGNOSIS — M9903 Segmental and somatic dysfunction of lumbar region: Secondary | ICD-10-CM | POA: Diagnosis not present

## 2017-08-27 DIAGNOSIS — M62830 Muscle spasm of back: Secondary | ICD-10-CM | POA: Diagnosis not present

## 2017-08-27 DIAGNOSIS — M9904 Segmental and somatic dysfunction of sacral region: Secondary | ICD-10-CM | POA: Diagnosis not present

## 2017-08-30 DIAGNOSIS — M9903 Segmental and somatic dysfunction of lumbar region: Secondary | ICD-10-CM | POA: Diagnosis not present

## 2017-08-30 DIAGNOSIS — M9904 Segmental and somatic dysfunction of sacral region: Secondary | ICD-10-CM | POA: Diagnosis not present

## 2017-08-30 DIAGNOSIS — M62830 Muscle spasm of back: Secondary | ICD-10-CM | POA: Diagnosis not present

## 2017-08-30 DIAGNOSIS — M5441 Lumbago with sciatica, right side: Secondary | ICD-10-CM | POA: Diagnosis not present

## 2017-09-20 ENCOUNTER — Encounter: Payer: Self-pay | Admitting: Obstetrics and Gynecology

## 2017-09-20 ENCOUNTER — Ambulatory Visit (INDEPENDENT_AMBULATORY_CARE_PROVIDER_SITE_OTHER): Payer: BLUE CROSS/BLUE SHIELD | Admitting: Obstetrics and Gynecology

## 2017-09-20 VITALS — BP 132/86 | HR 88 | Ht 60.0 in | Wt 146.8 lb

## 2017-09-20 DIAGNOSIS — Z01419 Encounter for gynecological examination (general) (routine) without abnormal findings: Secondary | ICD-10-CM

## 2017-09-20 NOTE — Progress Notes (Signed)
Subjective:   Rachel Cabrera is a 52 y.o. G60P0 Caucasian female here for a routine well-woman exam.  No LMP recorded. (Menstrual status: Oral contraceptives).    Current complaints: concerned about blood pressure. PCP: me       does desire labs  Social History: Sexual: heterosexual Marital Status: co-habitating Living situation: with partner / significant other Occupation: unknown occupation Tobacco/alcohol: no tobacco use Illicit drugs: no history of illicit drug use  The following portions of the patient's history were reviewed and updated as appropriate: allergies, current medications, past family history, past medical history, past social history, past surgical history and problem list.  Past Medical History Past Medical History:  Diagnosis Date  . Night sweat   . PMDD (premenstrual dysphoric disorder)   . UTI (lower urinary tract infection)     Past Surgical History Past Surgical History:  Procedure Laterality Date  . [place]  . TUBAL LIGATION  2004    Gynecologic History G2P0  No LMP recorded. (Menstrual status: Oral contraceptives). Contraception: tubal ligation Last Pap: 09/2016. Results were: normal Last mammogram: ?. Results were: normal   Obstetric History OB History  Gravida Para Term Preterm AB Living  2            SAB TAB Ectopic Multiple Live Births               # Outcome Date GA Lbr Len/2nd Weight Sex Delivery Anes PTL Lv  2 Gravida 1992     CS-Unspec     1 Gravida 1989     CS-Unspec       Current Medications Current Outpatient Medications on File Prior to Visit  Medication Sig Dispense Refill  . LO LOESTRIN FE 1 MG-10 MCG / 10 MCG tablet TAKE 1 TABLET BY MOUTH EVERY DAY 28 tablet 11  . Vitamin D, Ergocalciferol, (DRISDOL) 50000 units CAPS capsule Take 1 capsule (50,000 Units total) by mouth every 7 (seven) days. 30 capsule 1  . Methen-Hyosc-Meth Blue-Na Phos (UROGESIC-BLUE) 81.6 MG TABS Take 1 tablet (81.6 mg total) by  mouth 4 (four) times daily as needed. (Patient not taking: Reported on 09/14/2016) 30 tablet 1  . terconazole (TERAZOL 7) 0.4 % vaginal cream Place 1 applicator vaginally at bedtime. (Patient not taking: Reported on 09/20/2017) 45 g 0   No current facility-administered medications on file prior to visit.     Review of Systems Patient denies any headaches, blurred vision, shortness of breath, chest pain, abdominal pain, problems with bowel movements, urination, or intercourse.  Objective:  BP 132/86   Pulse 88   Ht 5' (1.524 m)   Wt 146 lb 12.8 oz (66.6 kg)   BMI 28.67 kg/m  Physical Exam  General:  Well developed, well nourished, no acute distress. She is alert and oriented x3. Skin:  Warm and dry Neck:  Midline trachea, no thyromegaly or nodules Cardiovascular: Regular rate and rhythm, no murmur heard Lungs:  Effort normal, all lung fields clear to auscultation bilaterally Breasts:  No dominant palpable mass, retraction, or nipple discharge Abdomen:  Soft, non tender, no hepatosplenomegaly or masses Pelvic:  External genitalia is normal in appearance.  The vagina is normal in appearance. The cervix is bulbous, no CMT.  Thin prep pap is not done. Uterus is felt to be normal size, shape, and contour.  No adnexal masses or tenderness noted. Extremities:  No swelling or varicosities noted Psych:  She has a normal mood and affect  Assessment:  Healthy well-woman exam  Plan:  Labs obtained -will follow up accordingly.  F/U 1 year for AE, or sooner if needed Mammogram ordered or sooner if problems Colonoscopy due  Melody Rockney Ghee, CNM

## 2017-09-21 LAB — COMPREHENSIVE METABOLIC PANEL
ALT: 22 IU/L (ref 0–32)
AST: 19 IU/L (ref 0–40)
Albumin/Globulin Ratio: 1.6 (ref 1.2–2.2)
Albumin: 4.3 g/dL (ref 3.5–5.5)
Alkaline Phosphatase: 36 IU/L — ABNORMAL LOW (ref 39–117)
BUN/Creatinine Ratio: 15 (ref 9–23)
BUN: 12 mg/dL (ref 6–24)
Bilirubin Total: 0.5 mg/dL (ref 0.0–1.2)
CO2: 21 mmol/L (ref 20–29)
Calcium: 9.3 mg/dL (ref 8.7–10.2)
Chloride: 100 mmol/L (ref 96–106)
Creatinine, Ser: 0.79 mg/dL (ref 0.57–1.00)
GFR calc Af Amer: 101 mL/min/{1.73_m2} (ref >59)
GFR calc non Af Amer: 88 mL/min/{1.73_m2} (ref >59)
Globulin, Total: 2.7 g/dL (ref 1.5–4.5)
Glucose: 110 mg/dL — ABNORMAL HIGH (ref 65–99)
Potassium: 4.1 mmol/L (ref 3.5–5.2)
Sodium: 136 mmol/L (ref 134–144)
Total Protein: 7 g/dL (ref 6.0–8.5)

## 2017-09-21 LAB — HEMOGLOBIN A1C
Est. average glucose Bld gHb Est-mCnc: 108 mg/dL
Hgb A1c MFr Bld: 5.4 % (ref 4.8–5.6)

## 2017-09-21 LAB — LIPID PANEL
Chol/HDL Ratio: 5.5 ratio — ABNORMAL HIGH (ref 0.0–4.4)
Cholesterol, Total: 279 mg/dL — ABNORMAL HIGH (ref 100–199)
HDL: 51 mg/dL (ref >39)
LDL Calculated: 201 mg/dL — ABNORMAL HIGH (ref 0–99)
Triglycerides: 136 mg/dL (ref 0–149)
VLDL Cholesterol Cal: 27 mg/dL (ref 5–40)

## 2017-09-25 ENCOUNTER — Other Ambulatory Visit: Payer: Self-pay

## 2017-09-25 ENCOUNTER — Telehealth: Payer: Self-pay | Admitting: Gastroenterology

## 2017-09-25 DIAGNOSIS — Z1211 Encounter for screening for malignant neoplasm of colon: Secondary | ICD-10-CM

## 2017-09-25 NOTE — Telephone Encounter (Signed)
Gastroenterology Pre-Procedure Review  Request Date: 11/06/17  [facility] Requesting Physician: Dr. Allen Norris  PATIENT REVIEW QUESTIONS: The patient responded to the following health history questions as indicated:    1. Are you having any GI issues? no 2. Do you have a personal history of Polyps? no 3. Do you have a family history of Colon Cancer or Polyps? no (Adopted no family hx) 4. Diabetes Mellitus? no 5. Joint replacements in the past 12 months?no 6. Major health problems in the past 3 months?no 7. Any artificial heart valves, MVP, or defibrillator?no    MEDICATIONS & ALLERGIES:    Patient reports the following regarding taking any anticoagulation/antiplatelet therapy:   Plavix, Coumadin, Eliquis, Xarelto, Lovenox, Pradaxa, Brilinta, or Effient? no Aspirin? no  Patient confirms/reports the following medications:  Current Outpatient Medications  Medication Sig Dispense Refill  . LO LOESTRIN FE 1 MG-10 MCG / 10 MCG tablet TAKE 1 TABLET BY MOUTH EVERY DAY 28 tablet 11  . Methen-Hyosc-Meth Blue-Na Phos (UROGESIC-BLUE) 81.6 MG TABS Take 1 tablet (81.6 mg total) by mouth 4 (four) times daily as needed. (Patient not taking: Reported on 09/14/2016) 30 tablet 1  . terconazole (TERAZOL 7) 0.4 % vaginal cream Place 1 applicator vaginally at bedtime. (Patient not taking: Reported on 09/20/2017) 45 g 0  . Vitamin D, Ergocalciferol, (DRISDOL) 50000 units CAPS capsule Take 1 capsule (50,000 Units total) by mouth every 7 (seven) days. 30 capsule 1   No current facility-administered medications for this visit.     Patient confirms/reports the following allergies:  Latex   No orders of the defined types were placed in this encounter.   AUTHORIZATION INFORMATION Primary Insurance: 1D#: Group #:  Secondary Insurance: 1D#: Group #:  SCHEDULE INFORMATION: Date: 11/06/17   Allen Norris Time: Location: [facility]

## 2017-10-03 ENCOUNTER — Other Ambulatory Visit: Payer: Self-pay | Admitting: Obstetrics and Gynecology

## 2017-10-03 ENCOUNTER — Telehealth: Payer: Self-pay | Admitting: *Deleted

## 2017-10-03 DIAGNOSIS — E785 Hyperlipidemia, unspecified: Secondary | ICD-10-CM | POA: Insufficient documentation

## 2017-10-03 MED ORDER — ATORVASTATIN CALCIUM 20 MG PO TABS: 20.0000 mg | ORAL_TABLET | Freq: Every day | ORAL | 6 refills | Status: DC

## 2017-10-03 NOTE — Telephone Encounter (Signed)
-----   Message from Joylene Igo, [place] sent at 10/03/2017 11:25 AM EDT ----- Please mail info on elevated cholesterol

## 2017-10-03 NOTE — Telephone Encounter (Signed)
Mailed info to pt 

## 2017-10-23 DIAGNOSIS — L578 Other skin changes due to chronic exposure to nonionizing radiation: Secondary | ICD-10-CM | POA: Diagnosis not present

## 2017-10-23 DIAGNOSIS — Z86018 Personal history of other benign neoplasm: Secondary | ICD-10-CM | POA: Diagnosis not present

## 2017-10-23 DIAGNOSIS — Z1283 Encounter for screening for malignant neoplasm of skin: Secondary | ICD-10-CM | POA: Diagnosis not present

## 2017-10-23 DIAGNOSIS — L821 Other seborrheic keratosis: Secondary | ICD-10-CM | POA: Diagnosis not present

## 2017-11-05 ENCOUNTER — Encounter: Payer: Self-pay | Admitting: Student

## 2017-11-06 ENCOUNTER — Encounter: Payer: Self-pay | Admitting: Anesthesiology

## 2017-11-06 ENCOUNTER — Ambulatory Visit: Payer: BLUE CROSS/BLUE SHIELD | Admitting: Anesthesiology

## 2017-11-06 ENCOUNTER — Ambulatory Visit
Admission: RE | Admit: 2017-11-06 | Discharge: 2017-11-06 | Disposition: A | Discharge status: Home / Self Care | Payer: BLUE CROSS/BLUE SHIELD | Source: Ambulatory Visit | Attending: Gastroenterology | Admitting: Gastroenterology

## 2017-11-06 ENCOUNTER — Encounter
Admission: RE | Disposition: A | Discharge status: Home / Self Care | Payer: Self-pay | Source: Ambulatory Visit | Attending: Gastroenterology

## 2017-11-06 DIAGNOSIS — I10 Essential (primary) hypertension: Secondary | ICD-10-CM | POA: Diagnosis not present

## 2017-11-06 DIAGNOSIS — D122 Benign neoplasm of ascending colon: Secondary | ICD-10-CM

## 2017-11-06 DIAGNOSIS — Z1211 Encounter for screening for malignant neoplasm of colon: Secondary | ICD-10-CM

## 2017-11-06 DIAGNOSIS — K635 Polyp of colon: Secondary | ICD-10-CM

## 2017-11-06 DIAGNOSIS — D124 Benign neoplasm of descending colon: Secondary | ICD-10-CM

## 2017-11-06 DIAGNOSIS — D125 Benign neoplasm of sigmoid colon: Secondary | ICD-10-CM | POA: Diagnosis not present

## 2017-11-06 DIAGNOSIS — Z79899 Other long term (current) drug therapy: Secondary | ICD-10-CM | POA: Diagnosis not present

## 2017-11-06 HISTORY — PX: COLONOSCOPY WITH PROPOFOL: SHX5780

## 2017-11-06 SURGERY — COLONOSCOPY WITH PROPOFOL
Anesthesia: General

## 2017-11-06 MED ORDER — MIDAZOLAM HCL 2 MG/2ML IJ SOLN
INTRAMUSCULAR | Status: DC | PRN
  Administered 2017-11-06: 2 mg via INTRAVENOUS

## 2017-11-06 MED ORDER — SODIUM CHLORIDE 0.9 % IV SOLN
INTRAVENOUS | Status: DC
  Administered 2017-11-06: 1000 mL via INTRAVENOUS

## 2017-11-06 MED ORDER — FENTANYL CITRATE (PF) 100 MCG/2ML IJ SOLN
INTRAMUSCULAR | Status: DC | PRN
  Administered 2017-11-06: 50 ug via INTRAVENOUS

## 2017-11-06 MED ORDER — PROPOFOL 500 MG/50ML IV EMUL
INTRAVENOUS | Status: DC | PRN
  Administered 2017-11-06: 100 ug/kg/min via INTRAVENOUS

## 2017-11-06 MED ORDER — MIDAZOLAM HCL 2 MG/2ML IJ SOLN
INTRAMUSCULAR | Status: AC
  Filled 2017-11-06: qty 2

## 2017-11-06 MED ORDER — EPHEDRINE SULFATE 50 MG/ML IJ SOLN
INTRAMUSCULAR | Status: AC
  Filled 2017-11-06: qty 1

## 2017-11-06 MED ORDER — FENTANYL CITRATE (PF) 100 MCG/2ML IJ SOLN
INTRAMUSCULAR | Status: AC
  Filled 2017-11-06: qty 2

## 2017-11-06 MED ORDER — PROPOFOL 500 MG/50ML IV EMUL
INTRAVENOUS | Status: AC
  Filled 2017-11-06: qty 50

## 2017-11-06 MED ORDER — EPHEDRINE SULFATE 50 MG/ML IJ SOLN
INTRAMUSCULAR | Status: DC | PRN
  Administered 2017-11-06 (×2): 10 mg via INTRAVENOUS

## 2017-11-06 NOTE — Op Note (Signed)
[facility] Gastroenterology Patient Name: Rachel Cabrera Procedure Date: 11/06/2017 8:14 AM MRN: [id number] Account #: [id number] Date of Birth: 12-01-1965 Admit Type: Outpatient Age: 52 Room: [facility] ENDO ROOM 4 Gender: Female Note Status: Finalized Procedure:            Colonoscopy Indications:          Screening for colorectal malignant neoplasm Providers:            Lucilla Lame MD, MD Referring MD:         No Local Md, MD (Referring MD) Medicines:            Propofol per Anesthesia Complications:        No immediate complications. Procedure:            Pre-Anesthesia Assessment:                       - Prior to the procedure, a History and Physical was                        performed, and patient medications and allergies were                        reviewed. The patient's tolerance of previous                        anesthesia was also reviewed. The risks and benefits of                        the procedure and the sedation options and risks were                        discussed with the patient. All questions were                        answered, and informed consent was obtained. Prior                        Anticoagulants: The patient has taken no previous                        anticoagulant or antiplatelet agents. ASA Grade                        Assessment: II - A patient with mild systemic disease.                        After reviewing the risks and benefits, the patient was                        deemed in satisfactory condition to undergo the                        procedure.                       After obtaining informed consent, the colonoscope was                        passed under direct vision. Throughout the procedure,  the patient's blood pressure, pulse, and oxygen                        saturations were monitored continuously. The                        Colonoscope was introduced through the anus and      advanced to the the cecum, identified by appendiceal                        orifice and ileocecal valve. The colonoscopy was                        performed without difficulty. The patient tolerated the                        procedure well. The quality of the bowel preparation                        was excellent. Findings:      The perianal and digital rectal examinations were normal.      A 8 mm polyp was found in the sigmoid colon. The polyp was pedunculated.       The polyp was removed with a hot snare. Resection and retrieval were       complete.      A 5 mm polyp was found in the descending colon. The polyp was sessile.       The polyp was removed with a cold snare. Resection and retrieval were       complete. To prevent bleeding post-intervention, one hemostatic clip was       successfully placed (MR conditional). There was no bleeding at the end       of the procedure.      A 5 mm polyp was found in the ascending colon. The polyp was sessile.       The polyp was removed with a cold snare. Resection and retrieval were       complete. Impression:           - One 8 mm polyp in the sigmoid colon, removed with a                        hot snare. Resected and retrieved.                       - One 5 mm polyp in the descending colon, removed with                        a cold snare. Resected and retrieved. Clip (MR                        conditional) was placed.                       - One 5 mm polyp in the ascending colon, removed with a                        cold snare. Resected and retrieved. Recommendation:       - Discharge patient to home.                       -  Resume previous diet.                       - Continue present medications.                       - Await pathology results.                       - Repeat colonoscopy in 5 years if polyp adenoma and 10                        years if hyperplastic Procedure Code(s):    --- Professional ---                        [phone], Colonoscopy, flexible; with removal of tumor(s),                        polyp(s), or other lesion(s) by snare technique Diagnosis Code(s):    --- Professional ---                       Z12.11, Encounter for screening for malignant neoplasm                        of colon                       D12.5, Benign neoplasm of sigmoid colon                       D12.4, Benign neoplasm of descending colon                       D12.2, Benign neoplasm of ascending colon CPT copyright 2017 American Medical Association. All rights reserved. The codes documented in this report are preliminary and upon coder review may  be revised to meet current compliance requirements. Lucilla Lame MD, MD 11/06/2017 8:34:59 AM This report has been signed electronically. Number of Addenda: 0 Note Initiated On: 11/06/2017 8:14 AM Scope Withdrawal Time: 0 hours 7 minutes 38 seconds  Total Procedure Duration: 0 hours 13 minutes 22 seconds       [facility]

## 2017-11-06 NOTE — Anesthesia Preprocedure Evaluation (Signed)
Anesthesia Evaluation  Patient identified by MRN, date of birth, ID band Patient awake    Reviewed: Allergy & Precautions, H&P , NPO status , Patient's Chart, lab work & pertinent test results  Airway Mallampati: III  TM Distance: >3 FB Neck ROM: full    Dental  (+) Teeth Intact   Pulmonary neg pulmonary ROS, neg shortness of breath, neg COPD,    breath sounds clear to auscultation       Cardiovascular hypertension, (-) angina(-) Past MI negative cardio ROS  (-) dysrhythmias  Rhythm:regular Rate:Normal     Neuro/Psych negative neurological ROS  negative psych ROS   GI/Hepatic negative GI ROS, Neg liver ROS,   Endo/Other  negative endocrine ROS  Renal/GU negative Renal ROS  negative genitourinary   Musculoskeletal   Abdominal   Peds  Hematology negative hematology ROS (+)   Anesthesia Other Findings Past Medical History: No date: Night sweat No date: PMDD (premenstrual dysphoric disorder) No date: UTI (lower urinary tract infection)  Past Surgical History: 1989, 1992: CESAREAN SECTION 2004: TUBAL LIGATION  BMI    Body Mass Index:  27.34 kg/m      Reproductive/Obstetrics negative OB ROS                             Anesthesia Physical Anesthesia Plan  ASA: II  Anesthesia Plan: General   Post-op Pain Management:    Induction:   PONV Risk Score and Plan: Propofol infusion and TIVA  Airway Management Planned: Natural Airway and Nasal Cannula  Additional Equipment:   Intra-op Plan:   Post-operative Plan:   Informed Consent: I have reviewed the patients History and Physical, chart, labs and discussed the procedure including the risks, benefits and alternatives for the proposed anesthesia with the patient or authorized representative who has indicated his/her understanding and acceptance.   Dental Advisory Given  Plan Discussed with: Anesthesiologist, CRNA and  Surgeon  Anesthesia Plan Comments:         Anesthesia Quick Evaluation

## 2017-11-06 NOTE — H&P (Signed)
Lucilla Lame, MD [address] [place], [place] [address] Phone: [phone] Fax : [phone]  Primary Care Physician:  Patient, No Pcp Per Primary Gastroenterologist:  Dr. Allen Norris  Pre-Procedure History & Physical: HPI:  Rachel Cabrera is a 52 y.o. female is here for a screening colonoscopy.   Past Medical History:  Diagnosis Date  . Night sweat   . PMDD (premenstrual dysphoric disorder)   . UTI (lower urinary tract infection)     Past Surgical History:  Procedure Laterality Date  . [place]  . TUBAL LIGATION  2004    Prior to Admission medications   Medication Sig Start Date End Date Taking? Authorizing Provider  LO LOESTRIN FE 1 MG-10 MCG / 10 MCG tablet TAKE 1 TABLET BY MOUTH EVERY DAY 08/28/17  Yes Shambley, Melody N, CNM  Vitamin D, Ergocalciferol, (DRISDOL) 50000 units CAPS capsule Take 1 capsule (50,000 Units total) by mouth every 7 (seven) days. 09/21/16  Yes Shambley, Melody N, CNM  atorvastatin (LIPITOR) 20 MG tablet Take 1 tablet (20 mg total) by mouth daily. Patient not taking: Reported on 11/06/2017 10/03/17   Shambley, Melody N, CNM  Methen-Hyosc-Meth Blue-Na Phos (UROGESIC-BLUE) 81.6 MG TABS Take 1 tablet (81.6 mg total) by mouth 4 (four) times daily as needed. Patient not taking: Reported on 09/14/2016 07/11/16   Joylene Igo, CNM  terconazole (TERAZOL 7) 0.4 % vaginal cream Place 1 applicator vaginally at bedtime. Patient not taking: Reported on 09/20/2017 08/20/17   Diona Fanti, CNM    Allergies as of 09/25/2017  . (No Known Allergies)    Family History  Adopted: Yes  Problem Relation Age of Onset  . Diabetes Father     Social History   Socioeconomic History  . Marital status: Single    Spouse name: Not on file  . Number of children: Not on file  . Years of education: Not on file  . Highest education level: Not on file  Occupational History  . Not on file  Social Needs  . Financial resource  strain: Not on file  . Food insecurity:    Worry: Not on file    Inability: Not on file  . Transportation needs:    Medical: Not on file    Non-medical: Not on file  Tobacco Use  . Smoking status: Never Smoker  . Smokeless tobacco: Never Used  Substance and Sexual Activity  . Alcohol use: Yes    Comment: occas  . Drug use: No  . Sexual activity: Yes    Birth control/protection: Surgical  Lifestyle  . Physical activity:    Days per week: Not on file    Minutes per session: Not on file  . Stress: Not on file  Relationships  . Social connections:    Talks on phone: Not on file    Gets together: Not on file    Attends religious service: Not on file    Active member of club or organization: Not on file    Attends meetings of clubs or organizations: Not on file    Relationship status: Not on file  . Intimate partner violence:    Fear of current or ex partner: Not on file    Emotionally abused: Not on file    Physically abused: Not on file    Forced sexual activity: Not on file  Other Topics Concern  . Not on file  Social History Narrative  . Not on file  Review of Systems: See HPI, otherwise negative ROS  Physical Exam: BP 134/87   Pulse 81   Temp (!) 96.4 F (35.8 C) (Tympanic)   Resp 18   Ht 5' (1.524 m)   Wt 63.5 kg   SpO2 100%   BMI 27.34 kg/m  General:   Alert,  pleasant and cooperative in NAD Head:  Normocephalic and atraumatic. Neck:  Supple; no masses or thyromegaly. Lungs:  Clear throughout to auscultation.    Heart:  Regular rate and rhythm. Abdomen:  Soft, nontender and nondistended. Normal bowel sounds, without guarding, and without rebound.   Neurologic:  Alert and  oriented x4;  grossly normal neurologically.  Impression/Plan: Rachel Cabrera is now here to undergo a screening colonoscopy.  Risks, benefits, and alternatives regarding colonoscopy have been reviewed with the patient.  Questions have been answered.  All parties agreeable.

## 2017-11-06 NOTE — Anesthesia Procedure Notes (Signed)
Performed by: Cook-Martin, Lyan Holck Pre-anesthesia Checklist: Patient identified, Emergency Drugs available, Suction available, Patient being monitored and Timeout performed Patient Re-evaluated:Patient Re-evaluated prior to induction Oxygen Delivery Method: Nasal cannula Preoxygenation: Pre-oxygenation with 100% oxygen Induction Type: IV induction Placement Confirmation: positive ETCO2 and CO2 detector       

## 2017-11-06 NOTE — Anesthesia Postprocedure Evaluation (Signed)
Anesthesia Post Note  Patient: Rachel Cabrera  Procedure(s) Performed: COLONOSCOPY WITH PROPOFOL (N/A )  Patient location during evaluation: PACU Anesthesia Type: General Level of consciousness: awake and alert Pain management: pain level controlled Vital Signs Assessment: post-procedure vital signs reviewed and stable Respiratory status: spontaneous breathing, nonlabored ventilation, respiratory function stable and patient connected to nasal cannula oxygen Cardiovascular status: blood pressure returned to baseline and stable Postop Assessment: no apparent nausea or vomiting Anesthetic complications: no     Last Vitals:  Vitals:   11/06/17 0838 11/06/17 0848  BP: 111/63 109/78  Pulse:    Resp: 20   Temp: (!) 36.1 C   SpO2:      Last Pain:  Vitals:   11/06/17 0858  TempSrc:   PainSc: 0-No pain                 Durenda Hurt

## 2017-11-06 NOTE — Transfer of Care (Signed)
Immediate Anesthesia Transfer of Care Note  Patient: Rachel Cabrera  Procedure(s) Performed: COLONOSCOPY WITH PROPOFOL (N/A )  Patient Location: PACU  Anesthesia Type:General  Level of Consciousness: awake and sedated  Airway & Oxygen Therapy: Patient Spontanous Breathing and Patient connected to nasal cannula oxygen  Post-op Assessment: Report given to RN and Post -op Vital signs reviewed and stable  Post vital signs: Reviewed and stable  Last Vitals:  Vitals Value Taken Time  BP    Temp    Pulse    Resp    SpO2      Last Pain:  Vitals:   11/06/17 0744  TempSrc: Tympanic  PainSc: 0-No pain         Complications: No apparent anesthesia complications

## 2017-11-06 NOTE — Anesthesia Post-op Follow-up Note (Signed)
Anesthesia QCDR form completed.        

## 2017-11-07 LAB — SURGICAL PATHOLOGY

## 2017-11-08 ENCOUNTER — Encounter: Payer: Self-pay | Admitting: Gastroenterology

## 2017-11-17 ENCOUNTER — Other Ambulatory Visit: Payer: Self-pay | Admitting: Obstetrics and Gynecology

## 2018-01-30 ENCOUNTER — Other Ambulatory Visit: Payer: BLUE CROSS/BLUE SHIELD

## 2018-01-30 DIAGNOSIS — E785 Hyperlipidemia, unspecified: Secondary | ICD-10-CM

## 2018-01-31 LAB — LIPID PANEL
Chol/HDL Ratio: 5.4 ratio — ABNORMAL HIGH (ref 0.0–4.4)
Cholesterol, Total: 223 mg/dL — ABNORMAL HIGH (ref 100–199)
HDL: 41 mg/dL (ref >39)
LDL Calculated: 161 mg/dL — ABNORMAL HIGH (ref 0–99)
Triglycerides: 105 mg/dL (ref 0–149)
VLDL Cholesterol Cal: 21 mg/dL (ref 5–40)

## 2018-03-27 ENCOUNTER — Ambulatory Visit: Payer: BLUE CROSS/BLUE SHIELD | Admitting: Primary Care

## 2018-03-27 ENCOUNTER — Encounter: Payer: Self-pay | Admitting: Primary Care

## 2018-03-27 DIAGNOSIS — R030 Elevated blood-pressure reading, without diagnosis of hypertension: Secondary | ICD-10-CM

## 2018-03-27 DIAGNOSIS — E785 Hyperlipidemia, unspecified: Secondary | ICD-10-CM

## 2018-03-27 DIAGNOSIS — E559 Vitamin D deficiency, unspecified: Secondary | ICD-10-CM | POA: Diagnosis not present

## 2018-03-27 DIAGNOSIS — G43709 Chronic migraine without aura, not intractable, without status migrainosus: Secondary | ICD-10-CM | POA: Diagnosis not present

## 2018-03-27 DIAGNOSIS — G43909 Migraine, unspecified, not intractable, without status migrainosus: Secondary | ICD-10-CM | POA: Insufficient documentation

## 2018-03-27 NOTE — Assessment & Plan Note (Signed)
Recent LDL December 2019 above goal at 161, however significant improvement from 201 in August 2019.  Suspect this is all secondary to dietary changes.  Recommended she work on regular exercise. We did have a discussion regarding her family history of heart disease along with her elevated cholesterol and elevated blood pressure readings.  We will start by repeating her cholesterol in August 2020 during her GYN appointment (or in our clinic if needed) and go from there.  She is open to statin treatment in the future if needed.

## 2018-03-27 NOTE — Assessment & Plan Note (Signed)
Slightly above goal today, borderline reading during GYN visit in August 2019. We will start by having her monitor home readings daily for the next 2 weeks, she will send readings via MyChart. We will also see her back in the office in 2 months for blood pressure check.

## 2018-03-27 NOTE — Progress Notes (Signed)
Subjective:    Patient ID: Rachel Cabrera, female    DOB: 20-Nov-1965, 53 y.o.   MRN: [id number]  HPI  Rachel Cabrera is a 53 year old female who presents today to establish care and discuss the problems mentioned below. Will obtain old records.  1) Hyperlipidemia: Previously managed on atorvastatin 20 mg which was prescribed per GYN. She never actually took the atorvastatin and worked on lifestyle changes. Last lipid panel was in December 2019 with LDL of 161. LDL of 201 in August 2019.  The 10-year ASCVD risk score Mikey Bussing DC Brooke Bonito., et al., 2013) is: 1.9%   Values used to calculate the score:     Age: 16 years     Sex: Female     Is Non-Hispanic African American: No     Diabetic: No     Tobacco smoker: No     Systolic Blood Pressure: 324 mmHg     Is BP treated: No     HDL Cholesterol: 41 mg/dL     Total Cholesterol: 223 mg/dL   She has significantly changed her since August 2019 is not eating fried food, less processed food, no red meat, no sugar or dairy. She is not exercising. She is adopted but has found that her birth father did die of a heart attack in his 63's.  2) Vitamin D Deficiency: Currently managed on vitamin D 50,000 units once weekly.  3) Elevated Blood Pressure: She has noticed intermittent symptoms of bilateral occipital headache, also clenches her jaw often and will experience jaw aches. She has checked her BP at home which is running 110's-120's/70's. She was in her closet yesterday looking for something, became frustrated and experienced burning around her shoulders and neck. She denies neck pain today.  BP Readings from Last 3 Encounters:  03/27/18 124/90  11/06/17 109/78  09/20/17 132/86   4) Frequent Headaches/Migraines: Prior history, experiences migraines 1-2 times annually, headaches occurring 2-3 times monthly. Her typical migraines occur to the bilateral frontal lobes and behind her eyes. She will notice photophobia and vomiting at times. She had a migraine last  night to same location, does have some residual symptoms today. She took Tylenol without much improvement.   She has never been on medications for migraines.    Review of Systems  Constitutional: Negative for fatigue.  Eyes: Negative for visual disturbance.  Respiratory: Negative for shortness of breath.   Cardiovascular: Negative for chest pain and palpitations.  Neurological: Positive for headaches. Negative for dizziness and numbness.       Past Medical History:  Diagnosis Date  . GERD (gastroesophageal reflux disease)   . Hyperlipidemia   . Hypertension   . Migraines   . Night sweat   . PMDD (premenstrual dysphoric disorder)   . UTI (lower urinary tract infection)      Social History   Socioeconomic History  . Marital status: Single    Spouse name: Not on file  . Number of children: Not on file  . Years of education: Not on file  . Highest education level: Not on file  Occupational History  . Not on file  Social Needs  . Financial resource strain: Not on file  . Food insecurity:    Worry: Not on file    Inability: Not on file  . Transportation needs:    Medical: Not on file    Non-medical: Not on file  Tobacco Use  . Smoking status: Never Smoker  . Smokeless tobacco: Never Used  Substance and Sexual Activity  . Alcohol use: Yes    Comment: occas  . Drug use: No  . Sexual activity: Yes    Birth control/protection: Surgical  Lifestyle  . Physical activity:    Days per week: Not on file    Minutes per session: Not on file  . Stress: Not on file  Relationships  . Social connections:    Talks on phone: Not on file    Gets together: Not on file    Attends religious service: Not on file    Active member of club or organization: Not on file    Attends meetings of clubs or organizations: Not on file    Relationship status: Not on file  . Intimate partner violence:    Fear of current or ex partner: Not on file    Emotionally abused: Not on file     Physically abused: Not on file    Forced sexual activity: Not on file  Other Topics Concern  . Not on file  Social History Narrative   Divorced. Engaged.   2 children.   Works in Insurance underwriter.   Enjoys riding motorcycles, camping.    Past Surgical History:  Procedure Laterality Date  . [place]  . COLONOSCOPY WITH PROPOFOL N/A 11/06/2017   Procedure: COLONOSCOPY WITH PROPOFOL;  Surgeon: Lucilla Lame, MD;  Location: [facility] ENDOSCOPY;  Service: Endoscopy;  Laterality: N/A;  . TUBAL LIGATION  2004    Family History  Adopted: Yes  Problem Relation Age of Onset  . Diabetes Father   . Heart attack Father 79  . Hypertension Father     No Known Allergies  Current Outpatient Medications on File Prior to Visit  Medication Sig Dispense Refill  . LO LOESTRIN FE 1 MG-10 MCG / 10 MCG tablet TAKE 1 TABLET BY MOUTH EVERY DAY 28 tablet 11  . Vitamin D, Ergocalciferol, (DRISDOL) 50000 units CAPS capsule TAKE 1 CAPSULE (50,000 UNITS TOTAL) BY MOUTH EVERY 7 (SEVEN) DAYS. 12 capsule 4   No current facility-administered medications on file prior to visit.     BP 124/90   Pulse 81   Temp 98.6 F (37 C) (Oral)   Ht 5' 0.5" (1.537 m)   Wt 136 lb 4 oz (61.8 kg)   SpO2 98%   BMI 26.17 kg/m    Objective:   Physical Exam  Constitutional: She appears well-nourished.  Neck: Neck supple.  Cardiovascular: Normal rate and regular rhythm.  Respiratory: Effort normal and breath sounds normal.  Skin: Skin is warm and dry.  Psychiatric: She has a normal mood and affect.           Assessment & Plan:

## 2018-03-27 NOTE — Patient Instructions (Signed)
Start monitoring your blood pressure daily, around the same time of day, for the next 2-3 weeks.  Ensure that you have rested for 30 minutes prior to checking your blood pressure. Record your readings and send them to me via my chart.  Start exercising. You should be getting 150 minutes of moderate intensity exercise weekly.  Continue to work on a healthy diet, congratulations on your weight loss!  Please schedule a follow up appointment in 2 months for blood pressure check, return sooner if you notice recurrent migraines/headaches.  It was a pleasure to meet you today! Please don't hesitate to call or message me with any questions. Welcome to [facility]!

## 2018-03-27 NOTE — Assessment & Plan Note (Signed)
Compliant to vitamin D 50,000 units once weekly per GYN.  Continue same.

## 2018-03-27 NOTE — Assessment & Plan Note (Signed)
Chronic for years, overall infrequent. She will monitor symptoms and report an increase in headaches/migraines. If her blood pressure is indeed elevated at home it could be triggering migraines.

## 2018-04-15 ENCOUNTER — Ambulatory Visit: Payer: BLUE CROSS/BLUE SHIELD | Admitting: Primary Care

## 2018-04-15 ENCOUNTER — Encounter: Payer: Self-pay | Admitting: Primary Care

## 2018-04-15 ENCOUNTER — Ambulatory Visit (INDEPENDENT_AMBULATORY_CARE_PROVIDER_SITE_OTHER)
Admission: RE | Admit: 2018-04-15 | Discharge: 2018-04-15 | Disposition: A | Discharge status: Home / Self Care | Payer: BLUE CROSS/BLUE SHIELD | Source: Ambulatory Visit | Attending: Primary Care | Admitting: Primary Care

## 2018-04-15 VITALS — BP 120/82 | HR 75 | Temp 98.0°F | Ht 60.5 in | Wt 139.8 lb

## 2018-04-15 DIAGNOSIS — G43709 Chronic migraine without aura, not intractable, without status migrainosus: Secondary | ICD-10-CM | POA: Diagnosis not present

## 2018-04-15 DIAGNOSIS — M542 Cervicalgia: Secondary | ICD-10-CM

## 2018-04-15 MED ORDER — CYCLOBENZAPRINE HCL 5 MG PO TABS: 5.0000 mg | ORAL_TABLET | Freq: Three times a day (TID) | ORAL | 0 refills | Status: DC | PRN

## 2018-04-15 MED ORDER — KETOROLAC TROMETHAMINE 60 MG/2ML IM SOLN
60.0000 mg | Freq: Once | INTRAMUSCULAR | Status: AC
  Administered 2018-04-15: 60 mg via INTRAMUSCULAR

## 2018-04-15 NOTE — Addendum Note (Signed)
Addended by: Jacqualin Combes on: 04/15/2018 03:32 PM   Modules accepted: Orders

## 2018-04-15 NOTE — Patient Instructions (Signed)
Complete xray(s) prior to leaving today. I will notify you of your results once received.  You can take the Flexeril (1-2 tablets) every 8 hours as needed for muscle spasms/neck pain. This may cause drowsiness.  It was a pleasure to see you today!

## 2018-04-15 NOTE — Progress Notes (Signed)
Subjective:    Patient ID: Rachel Cabrera, female    DOB: 1965-05-12, 53 y.o.   MRN: [id number]  HPI  Rachel Cabrera is a 53 year old female with a history of migraines who presents today with a chief complaint of neck pain and headaches.  Her neck pain that began about one month ago. Her pain is located to the lower posterior and right cervical spine. Her pain is worse with rotating her head to the right, also with sitting. Headaches are located to the parietal and occipital lobes, describes them as a dull pressure, will originate from neck and shoot up. She was evaluated in our clinic on 03/27/18 for migraine, symptoms resolved after her visit but has had neck pain prior to her visit. She's noticed sensitivity to the right occipital lobe when laying down. denies migraine since her visit on 03/27/18.   She denies numbness/tingling, pain down her bilateral lower extremities. She's tried Ibuprofen for neck and headaches without improvement.   BP Readings from Last 3 Encounters:  04/15/18 120/82  03/27/18 124/90  11/06/17 109/78     Review of Systems  Musculoskeletal: Positive for myalgias and neck pain.  Neurological: Positive for headaches. Negative for dizziness, weakness and numbness.       Past Medical History:  Diagnosis Date  . GERD (gastroesophageal reflux disease)   . Hyperlipidemia   . Hypertension   . Migraines   . Night sweat   . PMDD (premenstrual dysphoric disorder)   . UTI (lower urinary tract infection)      Social History   Socioeconomic History  . Marital status: Single    Spouse name: Not on file  . Number of children: Not on file  . Years of education: Not on file  . Highest education level: Not on file  Occupational History  . Not on file  Social Needs  . Financial resource strain: Not on file  . Food insecurity:    Worry: Not on file    Inability: Not on file  . Transportation needs:    Medical: Not on file    Non-medical: Not on file  Tobacco Use   . Smoking status: Never Smoker  . Smokeless tobacco: Never Used  Substance and Sexual Activity  . Alcohol use: Yes    Comment: occas  . Drug use: No  . Sexual activity: Yes    Birth control/protection: Surgical  Lifestyle  . Physical activity:    Days per week: Not on file    Minutes per session: Not on file  . Stress: Not on file  Relationships  . Social connections:    Talks on phone: Not on file    Gets together: Not on file    Attends religious service: Not on file    Active member of club or organization: Not on file    Attends meetings of clubs or organizations: Not on file    Relationship status: Not on file  . Intimate partner violence:    Fear of current or ex partner: Not on file    Emotionally abused: Not on file    Physically abused: Not on file    Forced sexual activity: Not on file  Other Topics Concern  . Not on file  Social History Narrative   Divorced. Engaged.   2 children.   Works in Insurance underwriter.   Enjoys riding motorcycles, camping.    Past Surgical History:  Procedure Laterality Date  . [place]  .  COLONOSCOPY WITH PROPOFOL N/A 11/06/2017   Procedure: COLONOSCOPY WITH PROPOFOL;  Surgeon: Lucilla Lame, MD;  Location: [facility] ENDOSCOPY;  Service: Endoscopy;  Laterality: N/A;  . TUBAL LIGATION  2004    Family History  Adopted: Yes  Problem Relation Age of Onset  . Diabetes Father   . Heart attack Father 44  . Hypertension Father     No Known Allergies  Current Outpatient Medications on File Prior to Visit  Medication Sig Dispense Refill  . LO LOESTRIN FE 1 MG-10 MCG / 10 MCG tablet TAKE 1 TABLET BY MOUTH EVERY DAY 28 tablet 11  . Vitamin D, Ergocalciferol, (DRISDOL) 50000 units CAPS capsule TAKE 1 CAPSULE (50,000 UNITS TOTAL) BY MOUTH EVERY 7 (SEVEN) DAYS. 12 capsule 4   No current facility-administered medications on file prior to visit.     BP 120/82   Pulse 75   Temp 98 F (36.7 C) (Oral)   Ht 5' 0.5" (1.537 m)    Wt 139 lb 12 oz (63.4 kg)   SpO2 97%   BMI 26.84 kg/m    Objective:   Physical Exam  Constitutional: She appears well-nourished.  Neck: Muscular tenderness present. No spinous process tenderness present.    Decrease in ROM with right lateral rotation of neck. Tenderness to mid and right posterior neck.  Skin: Skin is warm and dry.           Assessment & Plan:

## 2018-04-15 NOTE — Assessment & Plan Note (Signed)
Acute for the last one month, suspect this to be contributing to headaches.  Check cervical spine films today. Rx for Flexeril sent to pharmacy to use PRN, drowsiness precautions.  Discussed heat/ice, stretching.  IM Toradol 60 mg provided today.

## 2018-04-17 DIAGNOSIS — M542 Cervicalgia: Secondary | ICD-10-CM

## 2018-04-17 MED ORDER — DICLOFENAC SODIUM 75 MG PO TBEC: DELAYED_RELEASE_TABLET | ORAL | 0 refills | Status: DC

## 2018-05-01 DIAGNOSIS — M542 Cervicalgia: Secondary | ICD-10-CM | POA: Diagnosis not present

## 2018-05-07 DIAGNOSIS — M542 Cervicalgia: Secondary | ICD-10-CM | POA: Diagnosis not present

## 2018-07-24 ENCOUNTER — Other Ambulatory Visit: Payer: Self-pay | Admitting: Obstetrics and Gynecology

## 2018-09-23 ENCOUNTER — Telehealth: Payer: Self-pay

## 2018-09-23 NOTE — Telephone Encounter (Signed)
Coronavirus (COVID-19) Are you at risk?  Are you at risk for the Coronavirus (COVID-19)?  To be considered HIGH RISK for Coronavirus (COVID-19), you have to meet the following criteria:  . Traveled to [place], [place], [place], [place] or [place]; or in the [place] to [place], [place], [place], or [place]; and have fever, cough, and shortness of breath within the last 2 weeks of travel OR . Been in close contact with a person diagnosed with COVID-19 within the last 2 weeks and have fever, cough, and shortness of breath . IF YOU DO NOT MEET THESE CRITERIA, YOU ARE CONSIDERED LOW RISK FOR COVID-19.  What to do if you are HIGH RISK for COVID-19?  Marland Kitchen If you are having a medical emergency, call 911. . Seek medical care right away. Before you go to a doctor's office, urgent care or emergency department, call ahead and tell them about your recent travel, contact with someone diagnosed with COVID-19, and your symptoms. You should receive instructions from your physician's office regarding next steps of care.  . When you arrive at healthcare provider, tell the healthcare staff immediately you have returned from visiting [place], [place], [place], [place] or [place]; or traveled in the [place] to [place], [place], [place], or [place]; in the last two weeks or you have been in close contact with a person diagnosed with COVID-19 in the last 2 weeks.   . Tell the health care staff about your symptoms: fever, cough and shortness of breath. . After you have been seen by a medical provider, you will be either: o Tested for (COVID-19) and discharged home on quarantine except to seek medical care if symptoms worsen, and asked to  - Stay home and avoid contact with others until you get your results (4-5 days)  - Avoid travel on public transportation if possible (such as bus, train, or airplane) or o Sent to the Emergency Department by EMS for evaluation, COVID-19 testing, and possible  admission depending on your condition and test results.  What to do if you are LOW RISK for COVID-19?  Reduce your risk of any infection by using the same precautions used for avoiding the common cold or flu:  Marland Kitchen Wash your hands often with soap and warm water for at least 20 seconds.  If soap and water are not readily available, use an alcohol-based hand sanitizer with at least 60% alcohol.  . If coughing or sneezing, cover your mouth and nose by coughing or sneezing into the elbow areas of your shirt or coat, into a tissue or into your sleeve (not your hands). . Avoid shaking hands with others and consider head nods or verbal greetings only. . Avoid touching your eyes, nose, or mouth with unwashed hands.  . Avoid close contact with people who are Rachel Cabrera. . Avoid places or events with large numbers of people in one location, like concerts or sporting events. . Carefully consider travel plans you have or are making. . If you are planning any travel outside or inside the [place], visit the CDC's Travelers' Health webpage for the latest health notices. . If you have some symptoms but not all symptoms, continue to monitor at home and seek medical attention if your symptoms worsen. . If you are having a medical emergency, call 911.  09/23/18 SCREENING NEG SLS ADDITIONAL HEALTHCARE OPTIONS FOR PATIENTS  [facility] / e-Visit: [email or web address]         [facility]: [phone]  [facility]: [phone]                   [facility]: [phone]  

## 2018-09-24 ENCOUNTER — Encounter: Payer: Self-pay | Admitting: Obstetrics and Gynecology

## 2018-09-24 ENCOUNTER — Other Ambulatory Visit: Payer: Self-pay

## 2018-09-24 ENCOUNTER — Ambulatory Visit (INDEPENDENT_AMBULATORY_CARE_PROVIDER_SITE_OTHER): Payer: BC Managed Care – PPO | Admitting: Obstetrics and Gynecology

## 2018-09-24 VITALS — BP 135/81 | HR 78 | Ht 60.0 in | Wt 139.4 lb

## 2018-09-24 DIAGNOSIS — E559 Vitamin D deficiency, unspecified: Secondary | ICD-10-CM

## 2018-09-24 DIAGNOSIS — Z01419 Encounter for gynecological examination (general) (routine) without abnormal findings: Secondary | ICD-10-CM

## 2018-09-24 DIAGNOSIS — E785 Hyperlipidemia, unspecified: Secondary | ICD-10-CM | POA: Diagnosis not present

## 2018-09-24 DIAGNOSIS — M542 Cervicalgia: Secondary | ICD-10-CM

## 2018-09-24 DIAGNOSIS — Z6827 Body mass index (BMI) 27.0-27.9, adult: Secondary | ICD-10-CM

## 2018-09-24 MED ORDER — CYCLOBENZAPRINE HCL 5 MG PO TABS: 5.0000 mg | ORAL_TABLET | Freq: Three times a day (TID) | ORAL | 0 refills | Status: DC | PRN

## 2018-09-24 MED ORDER — VITAMIN D (ERGOCALCIFEROL) 1.25 MG (50000 UNIT) PO CAPS: 50000.0000 [IU] | ORAL_CAPSULE | ORAL | 4 refills | Status: DC

## 2018-09-24 MED ORDER — IBUPROFEN 800 MG PO TABS: 800.0000 mg | ORAL_TABLET | Freq: Three times a day (TID) | ORAL | 1 refills | Status: DC | PRN

## 2018-09-24 NOTE — Progress Notes (Signed)
Subjective:   Rachel Cabrera is a 53 y.o. G22P2002 Caucasian female here for a routine well-woman exam.  No LMP recorded. (Menstrual status: Oral contraceptives).    Current complaints: has been watching BP at home and readings 120s/70-80s, neck pain PCP: Carlis Abbott       does desire labs  Social History: Sexual: heterosexual Marital Status: co-habitating Living situation: with partner / significant other Occupation: unknown occupation Tobacco/alcohol: no tobacco use Illicit drugs: no history of illicit drug use  The following portions of the patient's history were reviewed and updated as appropriate: allergies, current medications, past family history, past medical history, past social history, past surgical history and problem list.  Past Medical History Past Medical History:  Diagnosis Date  . GERD (gastroesophageal reflux disease)   . Hyperlipidemia   . Hypertension   . Migraines   . Night sweat   . PMDD (premenstrual dysphoric disorder)   . UTI (lower urinary tract infection)     Past Surgical History Past Surgical History:  Procedure Laterality Date  . [place]  . COLONOSCOPY WITH PROPOFOL N/A 11/06/2017   Procedure: COLONOSCOPY WITH PROPOFOL;  Surgeon: Lucilla Lame, MD;  [facility] END[facility]COPY;  Service: Endoscopy;  Laterality: N/A;  . TUBAL LIGATION  2004    Gynecologic History [id number]  No LMP recorded. (Menstrual status: Oral contraceptives). Contraception: OCP (estrogen/progesterone) Last Pap: 2018. Results were: normal Last mammogram: not done   Obstetric History OB History  Gravida Para Term Preterm AB Living  2 2 2     2   SAB TAB Ectopic Multiple Live Births          2    # Outcome Date GA Lbr Len/2nd Weight Sex Delivery Anes PTL Lv  2 Term 01/13/91   9 lb 12 oz (4.423 kg) F CS-Unspec  N LIV  1 Term 12/28/87   9 lb 15 oz (4.508 kg) M CS-Unspec  N LIV    Current Medications Current Outpatient Medications on File Prior to Visit   Medication Sig Dispense Refill  . cyclobenzaprine (FLEXERIL) 5 MG tablet Take 1-2 tablets (5-10 mg total) by mouth 3 (three) times daily as needed for muscle spasms. 30 tablet 0  . LO LOESTRIN FE 1 MG-10 MCG / 10 MCG tablet TAKE 1 TABLET BY MOUTH EVERY DAY 84 tablet 3  . Vitamin D, Ergocalciferol, (DRISDOL) 50000 units CAPS capsule TAKE 1 CAPSULE (50,000 UNITS TOTAL) BY MOUTH EVERY 7 (SEVEN) DAYS. 12 capsule 4  . diclofenac (VOLTAREN) 75 MG EC tablet Take 1 tablet by mouth twice daily with food as needed for pain. (Patient not taking: Reported on 09/24/2018) 30 tablet 0   No current facility-administered medications on file prior to visit.     Review of Systems Patient denies any headaches, blurred vision, shortness of breath, chest pain, abdominal pain, problems with bowel movements, urination, or intercourse.  Objective:  BP 135/81   Pulse 78   Ht 5' (1.524 m)   Wt 139 lb 6 oz (63.2 kg)   BMI 27.22 kg/m  Physical Exam  General:  Well developed, well nourished, no acute distress. She is alert and oriented x3. Skin:  Warm and dry Neck:  Midline trachea, no thyromegaly or nodules Cardiovascular: Regular rate and rhythm, no murmur heard Lungs:  Effort normal, all lung fields clear to auscultation bilaterally Breasts:  No dominant palpable mass, retraction, or nipple discharge Abdomen:  Soft, non tender, no hepatosplenomegaly or masses Pelvic:  declined Extremities:  No  swelling or varicosities noted Psych:  She has a normal mood and affect  Assessment:   Healthy well-woman exam Acute neck pain Vit D deficiency Hyperlipidemia BMI 27   Plan:  Labs ordered- will return fasting in the next few weeks Will continue on OCPs for the next 6-12 month and will do trial off them to see if menopausal. Has BTL so no worries for [facility].  Discussed muscle tension headaches and treatment options- recommend deep tissue massage and contact card for Clarise Cruz given. Sent in prescription for  motrin 800mg  to take tid for next 3-5 days with bid flexeril. Will consider referral to Dr Tamala Julian in [place] if not resolved with above stated plan.   F/U 1 year for AE, or sooner if needed Mammogram ordered  Rakiyah Esch Rockney Ghee, CNM

## 2018-10-01 ENCOUNTER — Other Ambulatory Visit: Payer: Self-pay

## 2018-10-01 ENCOUNTER — Other Ambulatory Visit: Payer: BC Managed Care – PPO

## 2018-10-01 DIAGNOSIS — Z01419 Encounter for gynecological examination (general) (routine) without abnormal findings: Secondary | ICD-10-CM | POA: Diagnosis not present

## 2018-10-02 LAB — LIPID PANEL
Chol/HDL Ratio: 4.8 ratio — ABNORMAL HIGH (ref 0.0–4.4)
Cholesterol, Total: 239 mg/dL — ABNORMAL HIGH (ref 100–199)
HDL: 50 mg/dL (ref >39)
LDL Calculated: 161 mg/dL — ABNORMAL HIGH (ref 0–99)
Triglycerides: 140 mg/dL (ref 0–149)
VLDL Cholesterol Cal: 28 mg/dL (ref 5–40)

## 2018-10-02 LAB — COMPREHENSIVE METABOLIC PANEL
ALT: 10 IU/L (ref 0–32)
AST: 11 IU/L (ref 0–40)
Albumin/Globulin Ratio: 1.9 (ref 1.2–2.2)
Albumin: 4.4 g/dL (ref 3.8–4.9)
Alkaline Phosphatase: 39 IU/L (ref 39–117)
BUN/Creatinine Ratio: 18 (ref 9–23)
BUN: 14 mg/dL (ref 6–24)
Bilirubin Total: 0.3 mg/dL (ref 0.0–1.2)
CO2: 21 mmol/L (ref 20–29)
Calcium: 9.5 mg/dL (ref 8.7–10.2)
Chloride: 102 mmol/L (ref 96–106)
Creatinine, Ser: 0.78 mg/dL (ref 0.57–1.00)
GFR calc Af Amer: 102 mL/min/{1.73_m2} (ref >59)
GFR calc non Af Amer: 88 mL/min/{1.73_m2} (ref >59)
Globulin, Total: 2.3 g/dL (ref 1.5–4.5)
Glucose: 95 mg/dL (ref 65–99)
Potassium: 4.6 mmol/L (ref 3.5–5.2)
Sodium: 137 mmol/L (ref 134–144)
Total Protein: 6.7 g/dL (ref 6.0–8.5)

## 2018-10-02 LAB — HEMOGLOBIN A1C
Est. average glucose Bld gHb Est-mCnc: 105 mg/dL
Hgb A1c MFr Bld: 5.3 % (ref 4.8–5.6)

## 2018-10-02 LAB — VITAMIN D 25 HYDROXY (VIT D DEFICIENCY, FRACTURES): Vit D, 25-Hydroxy: 54.2 ng/mL (ref 30.0–100.0)

## 2018-10-02 LAB — TSH: TSH: 2.7 u[IU]/mL (ref 0.450–4.500)

## 2019-09-17 DIAGNOSIS — H35432 Paving stone degeneration of retina, left eye: Secondary | ICD-10-CM | POA: Diagnosis not present

## 2019-09-17 DIAGNOSIS — D3131 Benign neoplasm of right choroid: Secondary | ICD-10-CM | POA: Diagnosis not present

## 2019-09-17 DIAGNOSIS — H43812 Vitreous degeneration, left eye: Secondary | ICD-10-CM | POA: Diagnosis not present

## 2019-09-17 DIAGNOSIS — H43821 Vitreomacular adhesion, right eye: Secondary | ICD-10-CM | POA: Diagnosis not present

## 2019-11-05 ENCOUNTER — Ambulatory Visit (INDEPENDENT_AMBULATORY_CARE_PROVIDER_SITE_OTHER)
Admission: RE | Admit: 2019-11-05 | Discharge: 2019-11-05 | Disposition: A | Discharge status: Home / Self Care | Payer: BC Managed Care – PPO | Source: Ambulatory Visit | Attending: Primary Care | Admitting: Primary Care

## 2019-11-05 ENCOUNTER — Encounter: Payer: Self-pay | Admitting: Primary Care

## 2019-11-05 ENCOUNTER — Other Ambulatory Visit: Payer: Self-pay

## 2019-11-05 ENCOUNTER — Ambulatory Visit: Payer: BC Managed Care – PPO | Admitting: Primary Care

## 2019-11-05 VITALS — BP 128/82 | HR 103 | Temp 97.3°F | Ht 60.0 in | Wt 138.0 lb

## 2019-11-05 DIAGNOSIS — R319 Hematuria, unspecified: Secondary | ICD-10-CM | POA: Diagnosis not present

## 2019-11-05 DIAGNOSIS — R109 Unspecified abdominal pain: Secondary | ICD-10-CM

## 2019-11-05 DIAGNOSIS — N200 Calculus of kidney: Secondary | ICD-10-CM | POA: Diagnosis not present

## 2019-11-05 DIAGNOSIS — I878 Other specified disorders of veins: Secondary | ICD-10-CM | POA: Diagnosis not present

## 2019-11-05 LAB — POC URINALSYSI DIPSTICK (AUTOMATED)
Bilirubin, UA: NEGATIVE
Glucose, UA: NEGATIVE
Ketones, UA: NEGATIVE
Leukocytes, UA: NEGATIVE
Nitrite, UA: NEGATIVE
Protein, UA: NEGATIVE
Spec Grav, UA: <=1.005 — AB (ref 1.010–1.025)
Urobilinogen, UA: 0.2 E.U./dL
pH, UA: 6 (ref 5.0–8.0)

## 2019-11-05 MED ORDER — TAMSULOSIN HCL 0.4 MG PO CAPS: 0.4000 mg | ORAL_CAPSULE | Freq: Every day | ORAL | 0 refills | Status: DC

## 2019-11-05 NOTE — Assessment & Plan Note (Signed)
Also with hematuria. History of renal stone.  Exam and HPI most consistent for renal stone. UA today with 3+ blood, otherwise negative. Culture sent.  Start with KUB plain films. Rx for tamsulosin sent to pharmacy. I asked that she update me in 2 days. Consider CT if no improvement in symptoms.

## 2019-11-05 NOTE — Progress Notes (Signed)
Subjective:    Patient ID: Rachel Cabrera, female    DOB: [birth year], 54 y.o.   MRN: [id number]  HPI  This visit occurred during the SARS-CoV-2 public health emergency.  Safety protocols were in place, including screening questions prior to the visit, additional usage of staff PPE, and extensive cleaning of exam room while observing appropriate contact time as indicated for disinfecting solutions.   [name] is a 54 year old female with a history of renal stones who presents today with a chief complaint of gross hematuria and flank pain.  One week ago she noticed "fluttering" to the right lateral flank with radiation to the right pelvic region/groin. That same day she began to experience menses. She stopped OCP's in May 2021 per GYN and since then has had menses every 30 days since.  Since then she's noticed pink tinged and bright red blood when urinating that doesn't seem like her menses flow. In fact, she stopped menstruating a few days ago. She initially noticed clots with her menstrual cycle, this has since abated. She does notice fragments in the urine along with blood.   She has a history of renal stone in the past, this feels exactly the same. She describes her pain as sharp and "jabbing" intermittently. Also with urinary urgency. She denies fevers, chills, dysuria, vaginal symptoms.   She was in [place] two weeks prior to her symptoms, had more alcohol than usual.  BP Readings from Last 3 Encounters:  11/05/19 128/82  09/24/18 135/81  04/15/18 120/82     Review of Systems  Constitutional: Negative for fever.  Gastrointestinal: Negative for abdominal pain.  Genitourinary: Positive for flank pain, hematuria and urgency. Negative for frequency and menstrual problem.       Past Medical History:  Diagnosis Date  . GERD (gastroesophageal reflux disease)   . Hyperlipidemia   . Hypertension   . Migraines   . Night sweat   . PMDD (premenstrual dysphoric disorder)   . UTI  (lower urinary tract infection)      Social History   Socioeconomic History  . Marital status: Single    Spouse name: Not on file  . Number of children: Not on file  . Years of education: Not on file  . Highest education level: Not on file  Occupational History  . Not on file  Tobacco Use  . Smoking status: Never Smoker  . Smokeless tobacco: Never Used  Vaping Use  . Vaping Use: Never used  Substance and Sexual Activity  . Alcohol use: Yes    Comment: occas  . Drug use: No  . Sexual activity: Yes    Birth control/protection: Surgical  Other Topics Concern  . Not on file  Social History Narrative   Divorced. Engaged.   2 children.   Works in Insurance underwriter.   Enjoys riding motorcycles, camping.   Social Determinants of Health   Financial Resource Strain:   . Difficulty of Paying Living Expenses: Not on file  Food Insecurity:   . Worried About Charity fundraiser in the Last Year: Not on file  . Ran Out of Food in the Last Year: Not on file  Transportation Needs:   . Lack of Transportation (Medical): Not on file  . Lack of Transportation (Non-Medical): Not on file  Physical Activity:   . Days of Exercise per Week: Not on file  . Minutes of Exercise per Session: Not on file  Stress:   . Feeling of Stress : Not  on file  Social Connections:   . Frequency of Communication with Friends and Family: Not on file  . Frequency of Social Gatherings with Friends and Family: Not on file  . Attends Religious Services: Not on file  . Active Member of Clubs or Organizations: Not on file  . Attends Archivist Meetings: Not on file  . Marital Status: Not on file  Intimate Partner Violence:   . Fear of Current or Ex-Partner: Not on file  . Emotionally Abused: Not on file  . Physically Abused: Not on file  . Sexually Abused: Not on file    Past Surgical History:  Procedure Laterality Date  . [place]  . COLONOSCOPY WITH PROPOFOL N/A 11/06/2017    Procedure: COLONOSCOPY WITH PROPOFOL;  Surgeon: Lucilla Lame, MD;  Location: [facility] ENDOSCOPY;  Service: Endoscopy;  Laterality: N/A;  . TUBAL LIGATION  2004    Family History  Adopted: Yes  Problem Relation Age of Onset  . Diabetes Father   . Heart attack Father 31  . Hypertension Father     No Known Allergies  Current Outpatient Medications on File Prior to Visit  Medication Sig Dispense Refill  . cyclobenzaprine (FLEXERIL) 5 MG tablet Take 1-2 tablets (5-10 mg total) by mouth 3 (three) times daily as needed for muscle spasms. 30 tablet 0  . ibuprofen (ADVIL) 800 MG tablet Take 1 tablet (800 mg total) by mouth every 8 (eight) hours as needed. 60 tablet 1   No current facility-administered medications on file prior to visit.    BP 128/82   Pulse (!) 103   Temp (!) 97.3 F (36.3 C) (Temporal)   Ht 5' (1.524 m)   Wt 138 lb (62.6 kg)   SpO2 99%   BMI 26.95 kg/m    Objective:   Physical Exam Constitutional:      General: She is not in acute distress. Pulmonary:     Effort: Pulmonary effort is normal.  Abdominal:     General: Abdomen is flat.     Tenderness: There is abdominal tenderness in the suprapubic area. There is no right CVA tenderness, left CVA tenderness or guarding.    Neurological:     Mental Status: She is alert.            Assessment & Plan:

## 2019-11-05 NOTE — Patient Instructions (Addendum)
Start tamsulosin (Flomax) 0.4 mg. Take 1 capsule by mouth once daily for urine flow.  Complete xray(s) prior to leaving today. I will notify you of your results once received.  Please update me Friday this week as discussed.  It was a pleasure to see you today!

## 2019-11-06 LAB — URINE CULTURE
MICRO NUMBER:: 10982283
SPECIMEN QUALITY:: ADEQUATE

## 2019-11-15 ENCOUNTER — Other Ambulatory Visit: Payer: Self-pay

## 2019-11-15 ENCOUNTER — Emergency Department (HOSPITAL_COMMUNITY)
Admission: EM | Admit: 2019-11-15 | Discharge: 2019-11-16 | Disposition: A | Discharge status: Home / Self Care | Payer: BC Managed Care – PPO | Attending: Emergency Medicine | Admitting: Emergency Medicine

## 2019-11-15 ENCOUNTER — Encounter (HOSPITAL_COMMUNITY): Payer: Self-pay | Admitting: Emergency Medicine

## 2019-11-15 ENCOUNTER — Emergency Department (HOSPITAL_COMMUNITY): Payer: BC Managed Care – PPO

## 2019-11-15 DIAGNOSIS — Z5321 Procedure and treatment not carried out due to patient leaving prior to being seen by health care provider: Secondary | ICD-10-CM | POA: Insufficient documentation

## 2019-11-15 DIAGNOSIS — R110 Nausea: Secondary | ICD-10-CM | POA: Insufficient documentation

## 2019-11-15 DIAGNOSIS — R079 Chest pain, unspecified: Secondary | ICD-10-CM | POA: Insufficient documentation

## 2019-11-15 DIAGNOSIS — R0602 Shortness of breath: Secondary | ICD-10-CM | POA: Insufficient documentation

## 2019-11-15 LAB — BASIC METABOLIC PANEL
Anion gap: 10 (ref 5–15)
BUN: 14 mg/dL (ref 6–20)
CO2: 21 mmol/L — ABNORMAL LOW (ref 22–32)
Calcium: 9.8 mg/dL (ref 8.9–10.3)
Chloride: 104 mmol/L (ref 98–111)
Creatinine, Ser: 0.84 mg/dL (ref 0.44–1.00)
GFR calc Af Amer: >60 mL/min (ref >60)
GFR calc non Af Amer: >60 mL/min (ref >60)
Glucose, Bld: 107 mg/dL — ABNORMAL HIGH (ref 70–99)
Potassium: 3.2 mmol/L — ABNORMAL LOW (ref 3.5–5.1)
Sodium: 135 mmol/L (ref 135–145)

## 2019-11-15 LAB — CBC
HCT: 37.3 % (ref 36.0–46.0)
Hemoglobin: 12.3 g/dL (ref 12.0–15.0)
MCH: 30 pg (ref 26.0–34.0)
MCHC: 33 g/dL (ref 30.0–36.0)
MCV: 91 fL (ref 80.0–100.0)
Platelets: 338 10*3/uL (ref 150–400)
RBC: 4.1 MIL/uL (ref 3.87–5.11)
RDW: 11.7 % (ref 11.5–15.5)
WBC: 8.5 10*3/uL (ref 4.0–10.5)
nRBC: 0 % (ref 0.0–0.2)

## 2019-11-15 LAB — TROPONIN I (HIGH SENSITIVITY)
Troponin I (High Sensitivity): 3 ng/L (ref <18)
Troponin I (High Sensitivity): 4 ng/L (ref <18)

## 2019-11-15 LAB — I-STAT BETA HCG BLOOD, ED (MC, WL, AP ONLY): I-stat hCG, quantitative: 5.2 m[IU]/mL — ABNORMAL HIGH (ref <5)

## 2019-11-15 NOTE — ED Triage Notes (Signed)
Pt reports L sided chest pain that radiates to L side of neck, L arm, epigastric area, and R lateral chest since 2pm with SOB and nausea.  Pt anxious during triage.

## 2019-11-16 NOTE — ED Notes (Signed)
Pt stated she was leaving and going to try to go to urgent care in the morning.

## 2019-11-21 ENCOUNTER — Ambulatory Visit (INDEPENDENT_AMBULATORY_CARE_PROVIDER_SITE_OTHER): Payer: BC Managed Care – PPO | Admitting: Obstetrics and Gynecology

## 2019-11-21 ENCOUNTER — Encounter: Payer: Self-pay | Admitting: Obstetrics and Gynecology

## 2019-11-21 ENCOUNTER — Other Ambulatory Visit: Payer: Self-pay

## 2019-11-21 VITALS — BP 127/88 | HR 98 | Ht 60.0 in | Wt 132.3 lb

## 2019-11-21 DIAGNOSIS — R1011 Right upper quadrant pain: Secondary | ICD-10-CM | POA: Diagnosis not present

## 2019-11-21 DIAGNOSIS — Z1231 Encounter for screening mammogram for malignant neoplasm of breast: Secondary | ICD-10-CM

## 2019-11-21 DIAGNOSIS — Z01419 Encounter for gynecological examination (general) (routine) without abnormal findings: Secondary | ICD-10-CM | POA: Diagnosis not present

## 2019-11-21 DIAGNOSIS — Z30011 Encounter for initial prescription of contraceptive pills: Secondary | ICD-10-CM | POA: Diagnosis not present

## 2019-11-21 DIAGNOSIS — N951 Menopausal and female climacteric states: Secondary | ICD-10-CM | POA: Diagnosis not present

## 2019-11-21 DIAGNOSIS — E7800 Pure hypercholesterolemia, unspecified: Secondary | ICD-10-CM

## 2019-11-21 MED ORDER — TARINA 24 FE 1-20 MG-MCG(24) PO TABS: 1.0000 | ORAL_TABLET | Freq: Every day | ORAL | 3 refills | Status: DC

## 2019-11-21 NOTE — Progress Notes (Signed)
HPI:      [name] is a 54 y.o. [id number] who LMP was Patient's last menstrual period was 10/31/2019.  Subjective:   She presents today for her annual examination.  Other than her annual examination the patient has a number of things she would like to discuss. 1.  She was originally placed on OCPs by her last provider because her cycles were somewhat irregular.  Patient stopped OCPs in May.  She continues with regular monthly cycles however some menses were heavy and long and somewhat lighter.  She also states that she has significant mood changes associated with her cycle and she sometimes feels like "I am going crazy".  She is interested in restarting OCPs for both cycle control and possible mood improvement. 2.  Patient has been experiencing burning in her stomach area as well as vaginal right-sided pain.  This is not seem to be directly associated with meals. 3.  She reports that she has had an elevated cholesterol in the past and has tried to improve with her diet.  She has been unsuccessful in getting it below 200.  She is considering medication if her cholesterol is again elevated this year. She has a tubal ligation and her husband has a vasectomy for birth control.   Hx: The following portions of the patient's history were reviewed and updated as appropriate:             She  has a past medical history of GERD (gastroesophageal reflux disease), Hyperlipidemia, Hypertension, Migraines, Night sweat, PMDD (premenstrual dysphoric disorder), and UTI (lower urinary tract infection). She does not have any pertinent problems on file. She  has a past surgical history that includes Tubal ligation (2004); Cesarean section (1989, 1992); and Colonoscopy with propofol (N/A, 11/06/2017). Her family history includes Diabetes in her father; Heart attack (age of onset: 78) in her father; Hypertension in her father. She was adopted. She  reports that she has never smoked. She has never used smokeless  tobacco. She reports current alcohol use. She reports that she does not use drugs. She has a current medication list which includes the following prescription(s): cyclobenzaprine and ibuprofen.        Review of Systems:  Review of Systems  Constitutional: Denied constitutional symptoms, night sweats, recent illness, fatigue, fever, insomnia and weight loss.  Eyes: Denied eye symptoms, eye pain, photophobia, vision change and visual disturbance.  Ears/Nose/Throat/Neck: Denied ear, nose, throat or neck symptoms, hearing loss, nasal discharge, sinus congestion and sore throat.  Cardiovascular: Denied cardiovascular symptoms, arrhythmia, chest pain/pressure, edema, exercise intolerance, orthopnea and palpitations.  Respiratory: Denied pulmonary symptoms, asthma, pleuritic pain, productive sputum, cough, dyspnea and wheezing.  Gastrointestinal: Denied, gastro-esophageal reflux, melena, nausea and vomiting.  Genitourinary: Denied genitourinary symptoms including symptomatic vaginal discharge, pelvic relaxation issues, and urinary complaints.  Musculoskeletal: Denied musculoskeletal symptoms, stiffness, swelling, muscle weakness and myalgia.  Dermatologic: Denied dermatology symptoms, rash and scar.  Neurologic: Denied neurology symptoms, dizziness, headache, neck pain and syncope.  Psychiatric:  See HPI  Endocrine: Denied endocrine symptoms including hot flashes and night sweats.   Meds:   Current Outpatient Medications on File Prior to Visit  Medication Sig Dispense Refill  . cyclobenzaprine (FLEXERIL) 5 MG tablet Take 1-2 tablets (5-10 mg total) by mouth 3 (three) times daily as needed for muscle spasms. 30 tablet 0  . ibuprofen (ADVIL) 800 MG tablet Take 1 tablet (800 mg total) by mouth every 8 (eight) hours as needed. 60 tablet 1  No current facility-administered medications on file prior to visit.           Objective:     Vitals:   11/21/19 0856  BP: 127/88  Pulse: 98     Filed Weights   11/21/19 0856  Weight: 132 lb 4.8 oz (60 kg)            Physical examination General NAD, Conversant  HEENT Atraumatic; Op clear with mmm.  Normo-cephalic. Pupils reactive. Anicteric sclerae  Thyroid/Neck Smooth without nodularity or enlargement. Normal ROM.  Neck Supple.  Skin No rashes, lesions or ulceration. Normal palpated skin turgor. No nodularity.  Breasts: No masses or discharge.  Symmetric.  No axillary adenopathy.  Lungs: Clear to auscultation.No rales or wheezes. Normal Respiratory effort, no retractions.  Heart: NSR.  No murmurs or rubs appreciated. No periferal edema  Abdomen: Soft.  Non-tender.  No masses.  No HSM. No hernia  Extremities: Moves all appropriately.  Normal ROM for age. No lymphadenopathy.  Neuro: Oriented to PPT.  Normal mood. Normal affect.     Pelvic:   Vulva: Normal appearance.  No lesions.  Vagina: No lesions or abnormalities noted.  Support: Normal pelvic support.  Urethra No masses tenderness or scarring.  Meatus Normal size without lesions or prolapse.  Cervix: Normal appearance.  No lesions.  Anus: Normal exam.  No lesions.  Perineum: Normal exam.  No lesions.        Bimanual   Uterus: Normal size.  Non-tender.  Mobile.  AV.  Adnexae: No masses.  Non-tender to palpation.  Cul-de-sac: Negative for abnormality.      Assessment:    [id number] Patient Active Problem List   Diagnosis Date Noted  . Flank pain 11/05/2019  . Acute neck pain 04/15/2018  . Elevated blood pressure reading 03/27/2018  . Migraines 03/27/2018  . Encounter for screening colonoscopy   . Polyp of sigmoid colon   . Benign neoplasm of descending colon   . Benign neoplasm of ascending colon   . Hyperlipidemia 10/03/2017  . Vitamin D deficiency 09/21/2016     1. Well woman exam with routine gynecological exam   2. Encounter for screening mammogram for malignant neoplasm of breast   3. Symptomatic menopausal or female climacteric states   4.  Initiation of OCP (BCP)   5. Right upper quadrant abdominal pain   6. Elevated cholesterol     Patient seems to have some signs and symptoms of climacteric.  Mood changes and irregular menses.  She was originally happy on OCPs and would like to restart them.  I believe this is a good solution for her for both her mood changes and for better cycle control.  Patient has possible gastritis/possible gallbladder issues based on symptoms.  Patient to discuss this with her family doctor possible GI.  She has a history of elevated cholesterol which has been a problem despite actively working on this with a diet.  She is considering medication if her cholesterol is again elevated this year.   Plan:            1.  Basic Screening Recommendations The basic screening recommendations for asymptomatic women were discussed with the patient during her visit.  The age-appropriate recommendations were discussed with her and the rational for the tests reviewed.  When I am informed by the patient that another primary care physician has previously obtained the age-appropriate tests and they are up-to-date, only outstanding tests are ordered and referrals given as necessary.  Abnormal results  of tests will be discussed with her when all of her results are completed.  Routine preventative health maintenance measures emphasized: Exercise/Diet/Weight control, Tobacco Warnings, Alcohol/Substance use risks and Stress Management Mammogram ordered-blood work ordered patient will return fasting. 2.  Patient will restart Loestrin 3.  She will contact us if her cycles are not regulated and if she continues to experience mood changes during the climacteric. 4.  Consider statins if cholesterol elevated 5.  Possible follow-up with family doctor or GI for gallbladder disease. Orders Orders Placed This Encounter  Procedures  . MM 3D SCREEN BREAST BILATERAL    No orders of the defined types were placed in this encounter.     I  spent 21 additional minutes involved in the care of this patient preparing to see the patient by obtaining and reviewing her medical history (including labs, imaging tests and prior procedures), documenting clinical information in the electronic health record (EHR), counseling and coordinating care plans, writing and sending prescriptions, ordering tests or procedures and directly communicating with the patient by discussing pertinent items from her history and physical exam as well as detailing my assessment and plan as noted above so that she has an informed understanding.  All of her questions were answered.       F/U  No follow-ups on file.  Finis Bud, M.D. 11/21/2019 9:51 AM

## 2019-11-24 ENCOUNTER — Other Ambulatory Visit: Payer: Self-pay

## 2019-11-24 ENCOUNTER — Other Ambulatory Visit: Payer: Self-pay | Admitting: Surgical

## 2019-11-24 ENCOUNTER — Other Ambulatory Visit: Payer: BC Managed Care – PPO

## 2019-11-24 DIAGNOSIS — Z01419 Encounter for gynecological examination (general) (routine) without abnormal findings: Secondary | ICD-10-CM | POA: Diagnosis not present

## 2019-11-25 LAB — LIPID PANEL
Chol/HDL Ratio: 3.7 ratio (ref 0.0–4.4)
Cholesterol, Total: 200 mg/dL — ABNORMAL HIGH (ref 100–199)
HDL: 54 mg/dL (ref >39)
LDL Chol Calc (NIH): 127 mg/dL — ABNORMAL HIGH (ref 0–99)
Triglycerides: 104 mg/dL (ref 0–149)
VLDL Cholesterol Cal: 19 mg/dL (ref 5–40)

## 2019-11-25 LAB — TSH: TSH: 1.73 u[IU]/mL (ref 0.450–4.500)

## 2019-11-25 LAB — HEMOGLOBIN A1C
Est. average glucose Bld gHb Est-mCnc: 103 mg/dL
Hgb A1c MFr Bld: 5.2 % (ref 4.8–5.6)

## 2019-11-26 DIAGNOSIS — K219 Gastro-esophageal reflux disease without esophagitis: Secondary | ICD-10-CM | POA: Diagnosis not present

## 2019-11-26 DIAGNOSIS — M549 Dorsalgia, unspecified: Secondary | ICD-10-CM | POA: Diagnosis not present

## 2019-11-26 DIAGNOSIS — R0781 Pleurodynia: Secondary | ICD-10-CM | POA: Diagnosis not present

## 2019-11-26 DIAGNOSIS — R1031 Right lower quadrant pain: Secondary | ICD-10-CM | POA: Diagnosis not present

## 2019-11-27 ENCOUNTER — Other Ambulatory Visit: Payer: Self-pay | Admitting: Primary Care

## 2019-11-27 DIAGNOSIS — R319 Hematuria, unspecified: Secondary | ICD-10-CM

## 2019-11-27 DIAGNOSIS — R109 Unspecified abdominal pain: Secondary | ICD-10-CM

## 2019-11-28 ENCOUNTER — Other Ambulatory Visit (HOSPITAL_COMMUNITY): Payer: Self-pay | Admitting: Gastroenterology

## 2019-11-28 ENCOUNTER — Other Ambulatory Visit: Payer: Self-pay | Admitting: Gastroenterology

## 2019-11-28 DIAGNOSIS — R1011 Right upper quadrant pain: Secondary | ICD-10-CM

## 2019-12-01 ENCOUNTER — Other Ambulatory Visit: Payer: Self-pay | Admitting: Primary Care

## 2019-12-01 DIAGNOSIS — R319 Hematuria, unspecified: Secondary | ICD-10-CM

## 2019-12-01 DIAGNOSIS — R109 Unspecified abdominal pain: Secondary | ICD-10-CM

## 2019-12-02 ENCOUNTER — Ambulatory Visit (HOSPITAL_COMMUNITY)
Admission: RE | Admit: 2019-12-02 | Discharge: 2019-12-02 | Disposition: A | Discharge status: Home / Self Care | Payer: BC Managed Care – PPO | Source: Ambulatory Visit | Attending: Gastroenterology | Admitting: Gastroenterology

## 2019-12-02 ENCOUNTER — Other Ambulatory Visit: Payer: Self-pay

## 2019-12-02 DIAGNOSIS — R109 Unspecified abdominal pain: Secondary | ICD-10-CM | POA: Diagnosis not present

## 2019-12-02 DIAGNOSIS — R1011 Right upper quadrant pain: Secondary | ICD-10-CM | POA: Diagnosis not present

## 2020-05-20 ENCOUNTER — Ambulatory Visit: Payer: BC Managed Care – PPO | Admitting: Internal Medicine

## 2020-05-20 ENCOUNTER — Ambulatory Visit: Payer: BC Managed Care – PPO | Admitting: Family Medicine

## 2020-05-20 ENCOUNTER — Other Ambulatory Visit: Payer: Self-pay

## 2020-05-20 ENCOUNTER — Encounter: Payer: Self-pay | Admitting: Family Medicine

## 2020-05-20 VITALS — BP 120/70 | HR 77 | Temp 98.2°F | Ht 60.0 in | Wt 136.8 lb

## 2020-05-20 DIAGNOSIS — R0789 Other chest pain: Secondary | ICD-10-CM

## 2020-05-20 NOTE — Progress Notes (Signed)
Damya Comley T. Ikeem Cleckler, MD, [place] at [facility] [place], [address]  Phone: [phone]  FAX: [phone]  T[place] OBORN - 55 y.o. female  MRN [id number]  Date of Birth: 1965/11/20  Date: 05/20/2020  PCP: Pleas Koch, NP  Referral: Pleas Koch, NP  Chief Complaint  Patient presents with  . Knot on Sternum    This visit occurred during the SARS-CoV-2 public health emergency.  Safety protocols were in place, including screening questions prior to the visit, additional usage of staff PPE, and extensive cleaning of exam room while observing appropriate contact time as indicated for disinfecting solutions.   Subjective:   Rachel Cabrera is a 55 y.o. very pleasant female patient with Body mass index is 26.71 kg/m. who presents with the following:  For about the last week or a little bit longer she has had some pain at the edges of her sternum near the rib articulations.  She has not had any trauma, repetitive activity, or any change in her activities at all that she can think of.  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  She also has some neck pain that has been going on longstanding.  She denies back pain, abdominal pain.  No radicular symptoms.   Not drinking as much as in the office.   Review of Systems is noted in the HPI, as appropriate  Objective:   BP 120/70   Pulse 77   Temp 98.2 F (36.8 C) (Temporal)   Ht 5' (1.524 m)   Wt 136 lb 12 oz (62 kg)   SpO2 96%   BMI 26.71 kg/m   GEN: No acute distress; alert,appropriate. PULM: Breathing comfortably in no respiratory distress PSYCH: Normally interactive.   This portion of the physical examination was chaperoned by Hedy Camara, CMA.  Chest wall.  No pain with compression of all the ribs.  She does have pain with the junction of ribs and sternum  diffusely, and she also has some mild pain at the xiphoid process.  Laboratory and Imaging Data:  Assessment and Plan:     ICD-10-CM   1. Costochondral chest pain  R07.89    Reassurance, we reviewed the anatomy.  Generally this will get better in the order of weeks.  And could have her do some relatively high-dose anti-inflammatory scheduled.  Avoid activities that flares it up more.  Also with the xiphoid process inflammation.   Patient Instructions   Motrin 600 mg recommended.  (Over the counter Motrin, Advil, or Generic Ibuprofen 200 mg tablets. 3 tablets by mouth 3 times a day. This equals a prescription strength dose.)   Costochondritis  Costochondritis is irritation and swelling (inflammation) of the tissue that connects the ribs to the breastbone (sternum). This tissue is called cartilage. Costochondritis causes pain in the front of the chest. Usually, the pain:  Starts slowly.  Is in more than one rib. What are the causes? The exact cause of this condition is not always known. It results from stress on the tissue in the affected area. The cause of this stress could be:  Chest injury.  Exercise or activity, such as lifting.  Very bad coughing. What increases the risk? You are more likely to develop this condition if you:  Are female.  Are 72-54 years old.  Recently started a  new exercise or work activity.  Have low levels of vitamin D.  Have a condition that makes you cough often. What are the signs or symptoms? The main symptom of this condition is chest pain. The pain:  Usually starts slowly and can be sharp or dull.  Gets worse with deep breathing, coughing, or exercise.  Gets better with rest.  May be worse when you press on the affected area of your ribs and breastbone. How is this treated? This condition usually goes away on its own over time. Your doctor may prescribe an NSAID, such as ibuprofen. This can help reduce pain and inflammation.  Treatment may also include:  Resting and avoiding activities that make pain worse.  Putting heat or ice on the painful area.  Doing exercises to stretch your chest muscles. If these treatments do not help, your doctor may inject a numbing medicine to help relieve the pain. Follow these instructions at home: Managing pain, stiffness, and swelling  If told, put ice on the painful area. To do this: ? Put ice in a plastic bag. ? Place a towel between your skin and the bag. ? Leave the ice on for 20 minutes, 2-3 times a day.  If told, put heat on the affected area. Do this as often as told by your doctor. Use the heat source that your doctor recommends, such as a moist heat pack or a heating pad. ? Place a towel between your skin and the heat source. ? Leave the heat on for 20-30 minutes. ? Take off the heat if your skin turns bright red. This is very important if you cannot feel pain, heat, or cold. You may have a greater risk of getting burned.      Activity  Rest as told by your doctor.  Do not do anything that makes your pain worse. This includes any activities that use chest, belly (abdomen), and side muscles.  Do not lift anything that is heavier than 10 lb (4.5 kg), or the limit that you are told, until your doctor says that it is safe.  Return to your normal activities as told by your doctor. Ask your doctor what activities are safe for you. General instructions  Take over-the-counter and prescription medicines only as told by your doctor.  Keep all follow-up visits as told by your doctor. This is important. Contact a doctor if:  You have chills or a fever.  Your pain does not go away or it gets worse.  You have a cough that does not go away. Get help right away if:  You are short of breath.  You have very bad chest pain that is not helped by medicines, heat, or ice. These symptoms may be an emergency. Do not wait to see if the symptoms will go away. Get medical  help right away. Call your local emergency services (911 in the [place] Do not drive yourself to the hospital. Summary  Costochondritis is irritation and swelling (inflammation) of the tissue that connects the ribs to the breastbone (sternum).  This condition causes pain in the front of the chest.  Treatment may include medicines, rest, heat or ice, and exercises. This information is not intended to replace advice given to you by your health care provider. Make sure you discuss any questions you have with your health care provider. Document Revised: 12/13/2018 Document Reviewed: 12/13/2018 Elsevier Patient Education  2021 [place],  [place] T. Brennden Masten, MD   Outpatient Encounter  Medications as of 05/20/2020  Medication Sig  . cyclobenzaprine (FLEXERIL) 5 MG tablet Take 1-2 tablets (5-10 mg total) by mouth 3 (three) times daily as needed for muscle spasms.  . Multiple Vitamins-Minerals (MULTIVITAMIN ADULT, MINERALS,) TABS Take 1 tablet by mouth daily.  . [DISCONTINUED] ibuprofen (ADVIL) 800 MG tablet Take 1 tablet (800 mg total) by mouth every 8 (eight) hours as needed.  . [DISCONTINUED] Norethindrone Acetate-Ethinyl Estrad-FE (TARINA 24 FE) 1-20 MG-MCG(24) tablet Take 1 tablet by mouth daily.   No facility-administered encounter medications on file as of 05/20/2020.

## 2020-05-20 NOTE — Patient Instructions (Signed)
Motrin 600 mg recommended.  (Over the counter Motrin, Advil, or Generic Ibuprofen 200 mg tablets. 3 tablets by mouth 3 times a day. This equals a prescription strength dose.)   Costochondritis  Costochondritis is irritation and swelling (inflammation) of the tissue that connects the ribs to the breastbone (sternum). This tissue is called cartilage. Costochondritis causes pain in the front of the chest. Usually, the pain:  Starts slowly.  Is in more than one rib. What are the causes? The exact cause of this condition is not always known. It results from stress on the tissue in the affected area. The cause of this stress could be:  Chest injury.  Exercise or activity, such as lifting.  Very bad coughing. What increases the risk? You are more likely to develop this condition if you:  Are female.  Are 15-60 years old.  Recently started a new exercise or work activity.  Have low levels of vitamin D.  Have a condition that makes you cough often. What are the signs or symptoms? The main symptom of this condition is chest pain. The pain:  Usually starts slowly and can be sharp or dull.  Gets worse with deep breathing, coughing, or exercise.  Gets better with rest.  May be worse when you press on the affected area of your ribs and breastbone. How is this treated? This condition usually goes away on its own over time. Your doctor may prescribe an NSAID, such as ibuprofen. This can help reduce pain and inflammation. Treatment may also include:  Resting and avoiding activities that make pain worse.  Putting heat or ice on the painful area.  Doing exercises to stretch your chest muscles. If these treatments do not help, your doctor may inject a numbing medicine to help relieve the pain. Follow these instructions at home: Managing pain, stiffness, and swelling  If told, put ice on the painful area. To do this: ? Put ice in a plastic bag. ? Place a towel between your skin and  the bag. ? Leave the ice on for 20 minutes, 2-3 times a day.  If told, put heat on the affected area. Do this as often as told by your doctor. Use the heat source that your doctor recommends, such as a moist heat pack or a heating pad. ? Place a towel between your skin and the heat source. ? Leave the heat on for 20-30 minutes. ? Take off the heat if your skin turns bright red. This is very important if you cannot feel pain, heat, or cold. You may have a greater risk of getting burned.      Activity  Rest as told by your doctor.  Do not do anything that makes your pain worse. This includes any activities that use chest, belly (abdomen), and side muscles.  Do not lift anything that is heavier than 10 lb (4.5 kg), or the limit that you are told, until your doctor says that it is safe.  Return to your normal activities as told by your doctor. Ask your doctor what activities are safe for you. General instructions  Take over-the-counter and prescription medicines only as told by your doctor.  Keep all follow-up visits as told by your doctor. This is important. Contact a doctor if:  You have chills or a fever.  Your pain does not go away or it gets worse.  You have a cough that does not go away. Get help right away if:  You are short of breath.  You  have very bad chest pain that is not helped by medicines, heat, or ice. These symptoms may be an emergency. Do not wait to see if the symptoms will go away. Get medical help right away. Call your local emergency services (911 in the [place] Do not drive yourself to the hospital. Summary  Costochondritis is irritation and swelling (inflammation) of the tissue that connects the ribs to the breastbone (sternum).  This condition causes pain in the front of the chest.  Treatment may include medicines, rest, heat or ice, and exercises. This information is not intended to replace advice given to you by your health care provider. Make sure you  discuss any questions you have with your health care provider. Document Revised: 12/13/2018 Document Reviewed: 12/13/2018 Elsevier Patient Education  2021 [employer].

## 2020-06-13 ENCOUNTER — Ambulatory Visit
Admission: RE | Admit: 2020-06-13 | Discharge: 2020-06-13 | Disposition: A | Discharge status: Home / Self Care | Payer: BC Managed Care – PPO | Source: Ambulatory Visit

## 2020-06-13 ENCOUNTER — Other Ambulatory Visit: Payer: Self-pay

## 2020-06-13 VITALS — BP 118/81 | HR 94 | Temp 98.6°F | Resp 16

## 2020-06-13 DIAGNOSIS — J0100 Acute maxillary sinusitis, unspecified: Secondary | ICD-10-CM

## 2020-06-13 MED ORDER — PROMETHAZINE-DM 6.25-15 MG/5ML PO SYRP: 5.0000 mL | ORAL_SOLUTION | Freq: Four times a day (QID) | ORAL | 0 refills | Status: DC | PRN

## 2020-06-13 MED ORDER — FLUCONAZOLE 150 MG PO TABS: 150.0000 mg | ORAL_TABLET | Freq: Once | ORAL | 0 refills | Status: AC

## 2020-06-13 MED ORDER — AMOXICILLIN 875 MG PO TABS: 875.0000 mg | ORAL_TABLET | Freq: Two times a day (BID) | ORAL | 0 refills | Status: DC

## 2020-06-13 MED ORDER — PREDNISONE 10 MG PO TABS: 10.0000 mg | ORAL_TABLET | Freq: Every day | ORAL | 0 refills | Status: AC

## 2020-06-13 NOTE — ED Triage Notes (Signed)
Pt reports starting with cough approx 9 days ago.  Only has slight cough left, but now c/o significant nasal congestion, sinus pressure, and ear pain/pressure over past couple days after being outside for funeral.  Denies fevers.

## 2020-06-13 NOTE — ED Provider Notes (Signed)
[facility]    CSN: [id number] Arrival date & time: 06/13/20  1127      History   Chief Complaint Chief Complaint  Patient presents with  . Facial Pain  . Ear Pain  . appointment    1200    HPI Rachel Cabrera is a 55 y.o. female.   HPI Patient presents today with greater than 7 days of facial pressure, cough, nasal congestion, purulent nasal drainage.  Patient has taken over-the-counter Mucinex, Sudafed and Afrin which initially helped with symptoms however symptoms have subsequently dramatically worsened.  She is severely congested, with bilateral swelling and facial pressure along with teeth pain.  She is afebrile at present.  She recently developed outdoor seasonal allergies and attributes initial symptoms to outdoor pollen. Subsequently she has developed ear pain.   Past Medical History:  Diagnosis Date  . GERD (gastroesophageal reflux disease)   . Hyperlipidemia   . Hypertension   . Migraines   . Night sweat   . PMDD (premenstrual dysphoric disorder)   . UTI (lower urinary tract infection)     Patient Active Problem List   Diagnosis Date Noted  . Flank pain 11/05/2019  . Acute neck pain 04/15/2018  . Elevated blood pressure reading 03/27/2018  . Migraines 03/27/2018  . Encounter for screening colonoscopy   . Polyp of sigmoid colon   . Benign neoplasm of descending colon   . Benign neoplasm of ascending colon   . Hyperlipidemia 10/03/2017  . Vitamin D deficiency 09/21/2016    Past Surgical History:  Procedure Laterality Date  . [place]  . COLONOSCOPY WITH PROPOFOL N/A 11/06/2017   Procedure: COLONOSCOPY WITH PROPOFOL;  Surgeon: Lucilla Lame, MD;  Location: [facility] END[facility]COPY;  Service: Endoscopy;  Laterality: N/A;  . TUBAL LIGATION  2004    OB History    Gravida  2   Para  2   Term  2   Preterm      AB      Living  2     SAB      IAB      Ectopic      Multiple      Live Births  2            Home  Medications    Prior to Admission medications   Medication Sig Start Date End Date Taking? Authorizing Provider  Oxymetazoline HCl (AFRIN NODRIP SINUS NA) Place into the nose.   Yes [provider]  PSEUDOEPHEDRINE HCL PO Take by mouth.   Yes [provider]  cyclobenzaprine (FLEXERIL) 5 MG tablet Take 1-2 tablets (5-10 mg total) by mouth 3 (three) times daily as needed for muscle spasms. 09/24/18   Shambley, Melody N, CNM  Multiple Vitamins-Minerals (MULTIVITAMIN ADULT, MINERALS,) TABS Take 1 tablet by mouth daily.    [provider]    Family History Family History  Adopted: Yes  Problem Relation Age of Onset  . Diabetes Father   . Heart attack Father 84  . Hypertension Father   . Healthy Mother     Social History Social History   Tobacco Use  . Smoking status: Never Smoker  . Smokeless tobacco: Never Used  Vaping Use  . Vaping Use: Never used  Substance Use Topics  . Alcohol use: Yes    Comment: occasional  . Drug use: No     Allergies   Patient has no known allergies.   Review of Systems Review of Systems  Pertinent negatives listed in HPI   Physical Exam Triage Vital Signs ED Triage Vitals  Enc Vitals Group     BP 06/13/20 1222 118/81     Pulse Rate 06/13/20 1222 94     Resp 06/13/20 1222 16     Temp 06/13/20 1222 98.6 F (37 C)     Temp Source 06/13/20 1222 Temporal     SpO2 06/13/20 1222 98 %     Weight --      Height --      Head Circumference --      Peak Flow --      Pain Score 06/13/20 1223 8     Pain Loc --      Pain Edu? --      Excl. in [place]? --    No data found.  Updated Vital Signs BP 118/81   Pulse 94   Temp 98.6 F (37 C) (Temporal)   Resp 16   SpO2 98%   Visual Acuity Right Eye Distance:   Left Eye Distance:   Bilateral Distance:    Right Eye Near:   Left Eye Near:    Bilateral Near:     Physical Exam  General Appearance:    Alert, cooperative, no distress  HENT:   Normocephalic, ears  normal, nares mucosal edema with congestion copious mucus , rhinorrhea, oropharynx    Eyes:    PERRL, conjunctiva/corneas clear, EOM's intact       Lungs:     Clear to auscultation bilaterally, respirations unlabored  Heart:    Regular rate and rhythm  Neurologic:   Awake, alert, oriented x 3. No apparent focal neurological           defect.      UC Treatments / Results  Labs (all labs ordered are listed, but only abnormal results are displayed) Labs Reviewed - No data to display  EKG   Radiology No results found.  Procedures Procedures (including critical care time)  Medications Ordered in UC Medications - No data to display  Initial Impression / Assessment and Plan / UC Course  I have reviewed the triage vital signs and the nursing notes.  Pertinent labs & imaging results that were available during my care of the patient were reviewed by me and considered in my medical decision making (see chart for details).     Acute sinusitis treatment per discharge medications instructions.  Return precautions given.  Diflucan prescribed prophylactically -secondary to the antibiotic therapy  Final Clinical Impressions(s) / UC Diagnoses   Final diagnoses:  Acute non-recurrent maxillary sinusitis   Discharge Instructions   None    ED Prescriptions    Medication Sig Dispense Auth. Provider   fluconazole (DIFLUCAN) 150 MG tablet Take 1 tablet (150 mg total) by mouth once for 1 dose. Repeat if needed 2 tablet Scot Jun, FNP   amoxicillin (AMOXIL) 875 MG tablet Take 1 tablet (875 mg total) by mouth 2 (two) times daily. 20 tablet Scot Jun, FNP   predniSONE (DELTASONE) 10 MG tablet Take 1 tablet (10 mg total) by mouth daily before breakfast for 5 days. 5 tablet Scot Jun, FNP   promethazine-dextromethorphan (PROMETHAZINE-DM) 6.25-15 MG/5ML syrup Take 5 mLs by mouth 4 (four) times daily as needed for cough. 140 mL Scot Jun, FNP     PDMP not reviewed  this encounter.   Scot Jun, FNP 06/13/20 1315

## 2020-11-23 IMAGING — US US ABDOMEN LIMITED
1 series · 14 of 25 positions shown · non-contrast
Comparison: CT abdomen pelvis dated 12/07/2014.

CLINICAL DATA: 54-year-old female with right upper quadrant
abdominal pain.

EXAM:
ULTRASOUND ABDOMEN LIMITED RIGHT UPPER QUADRANT

[Series 1: us abdomen limited · 14 of 39 slices shown]
[im 1/39]
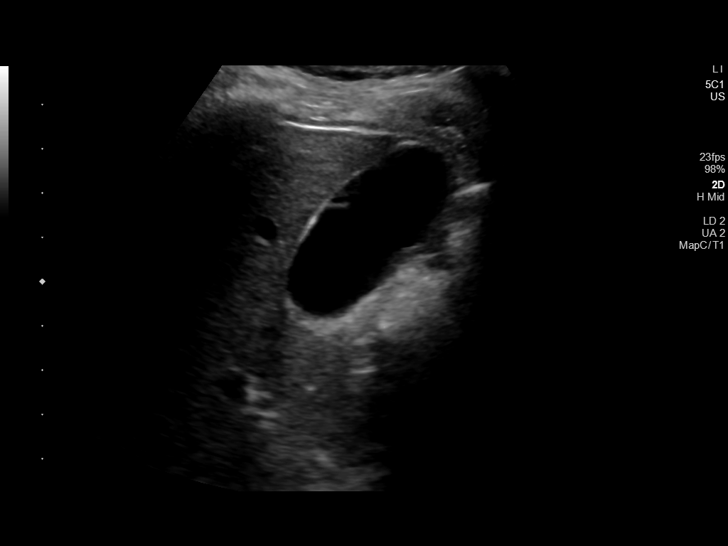
[im 4/39]
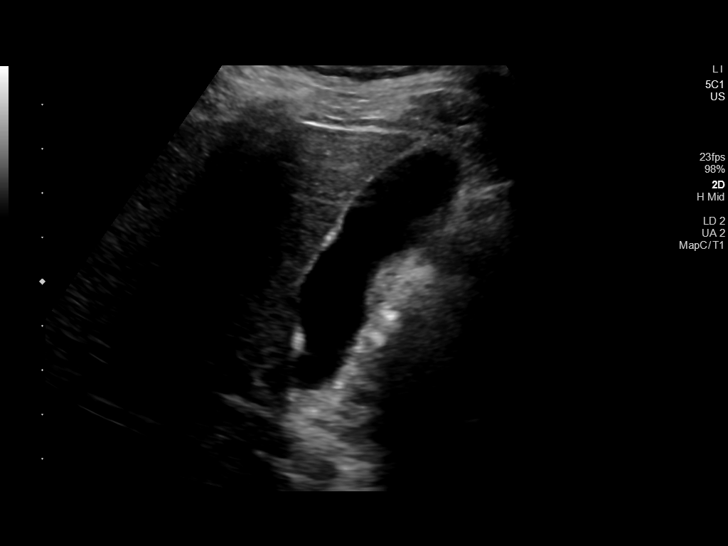
[im 7/39]
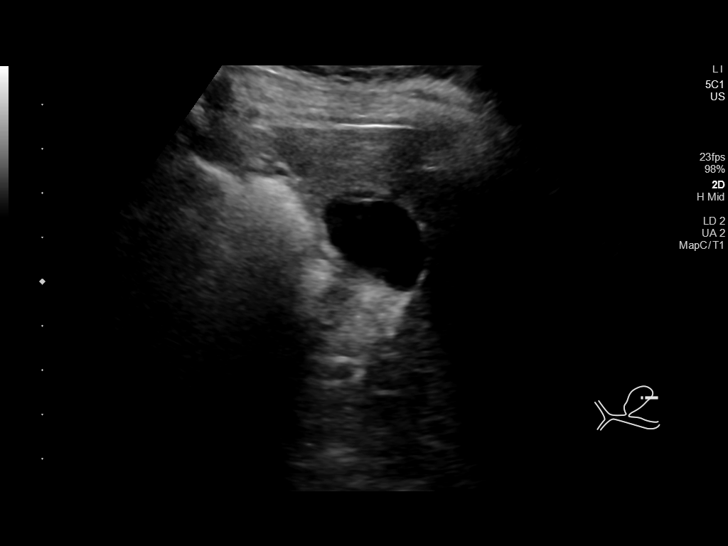
[im 10/39]
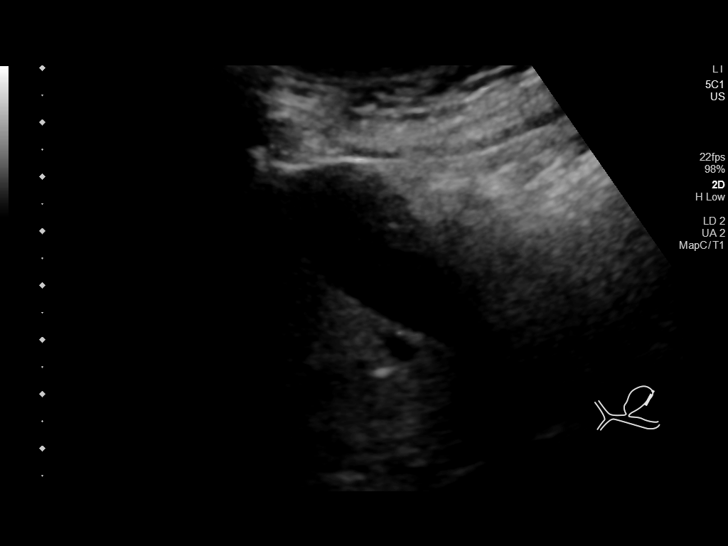
[im 13/39]
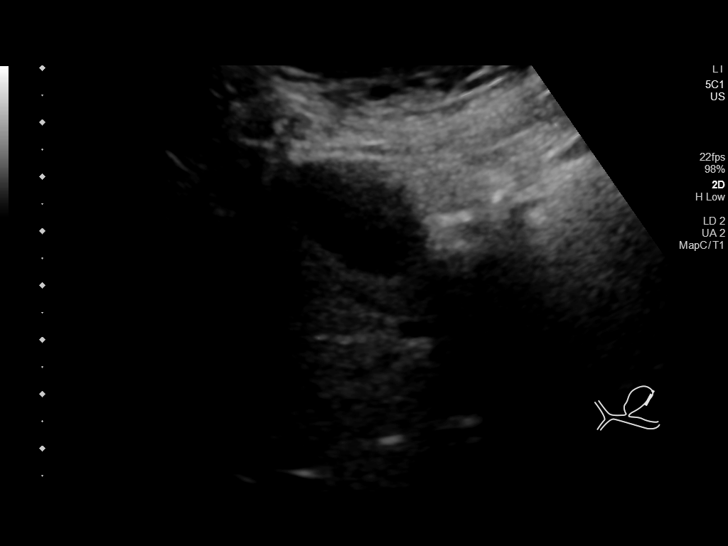
[im 15/39]
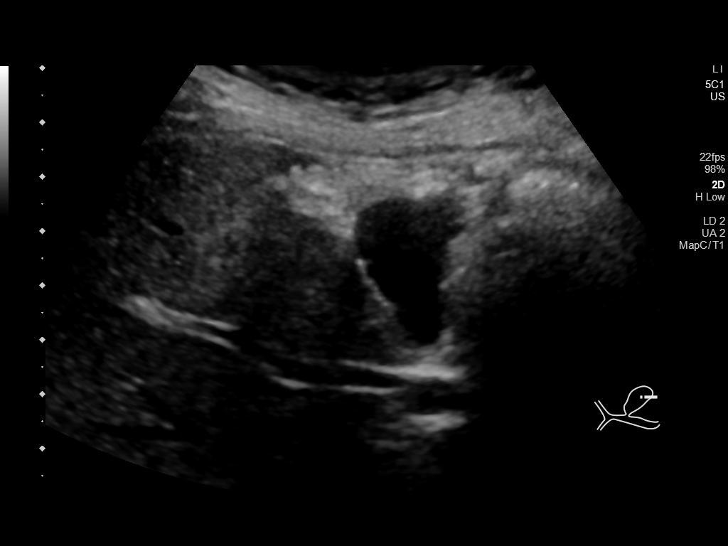
[im 18/39]
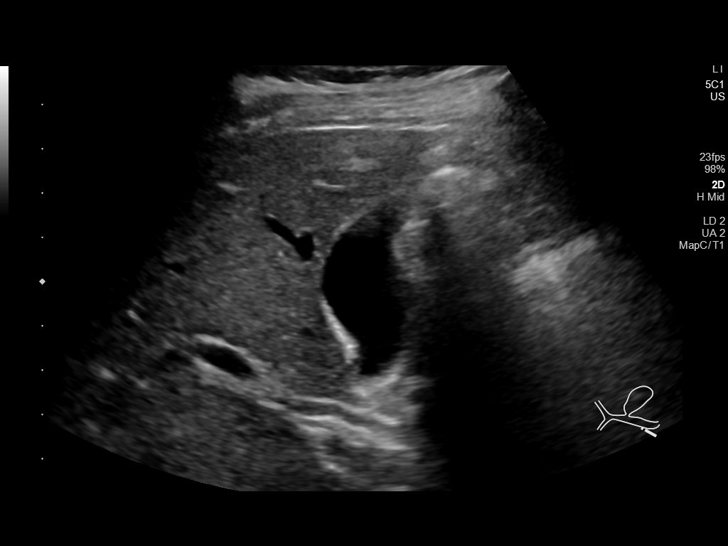
[im 21/39]
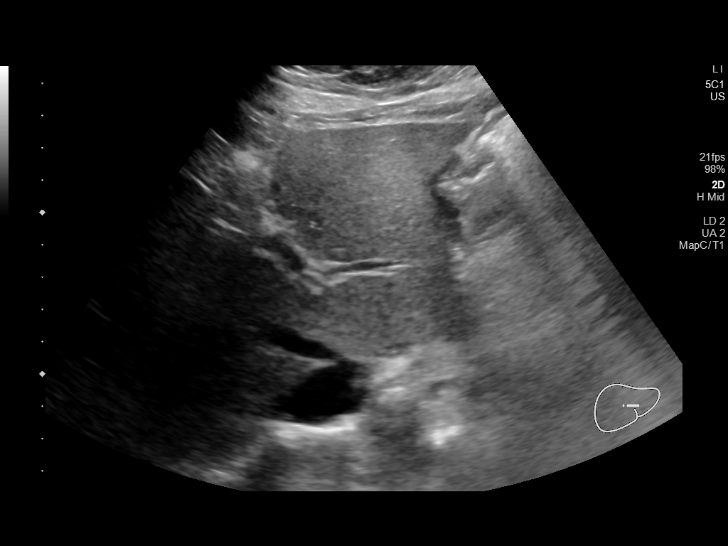
[im 24/39]
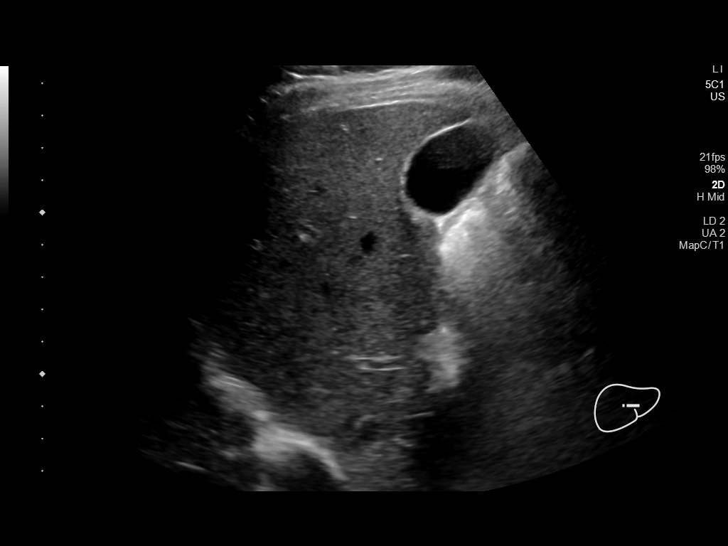
[im 26/39]
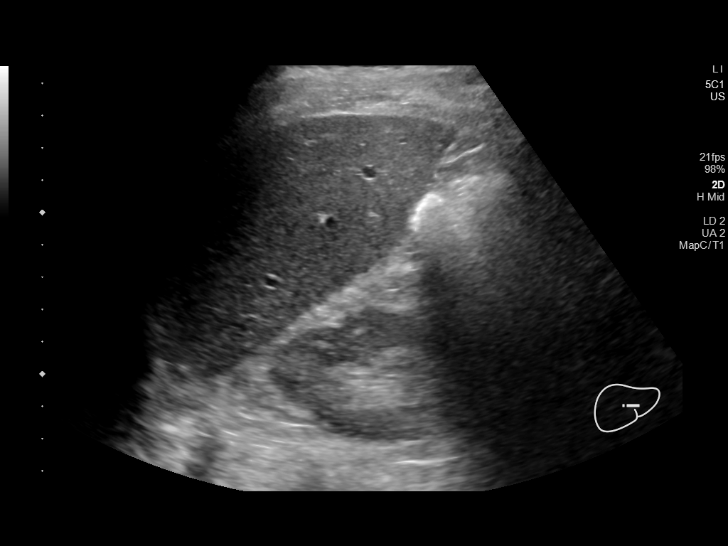
[im 29/39]
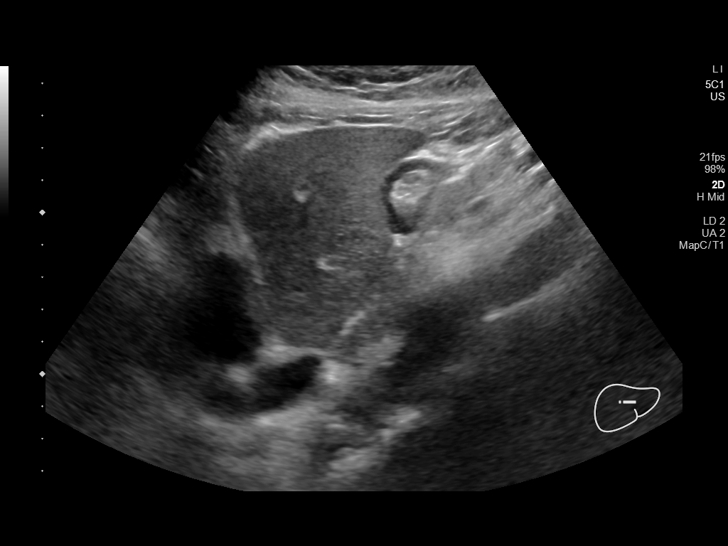
[im 32/39]
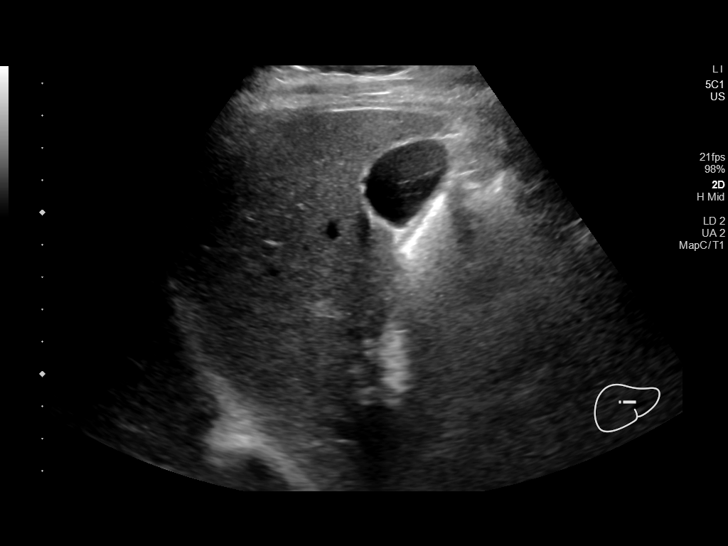
[im 35/39]
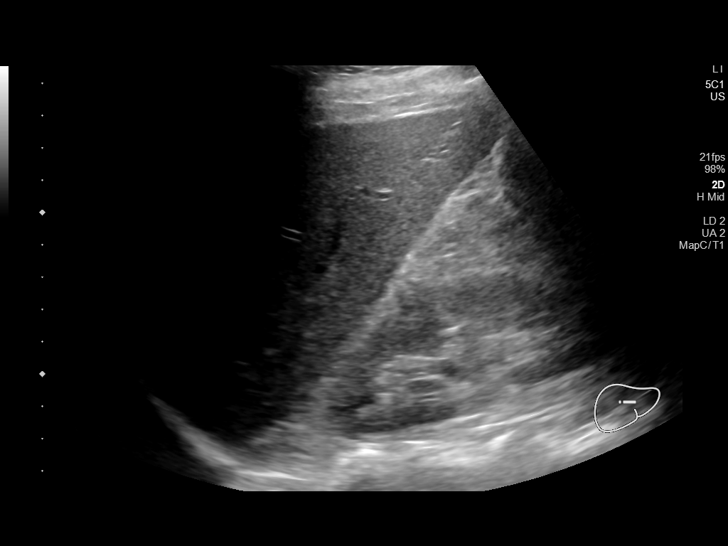
[im 39/39]
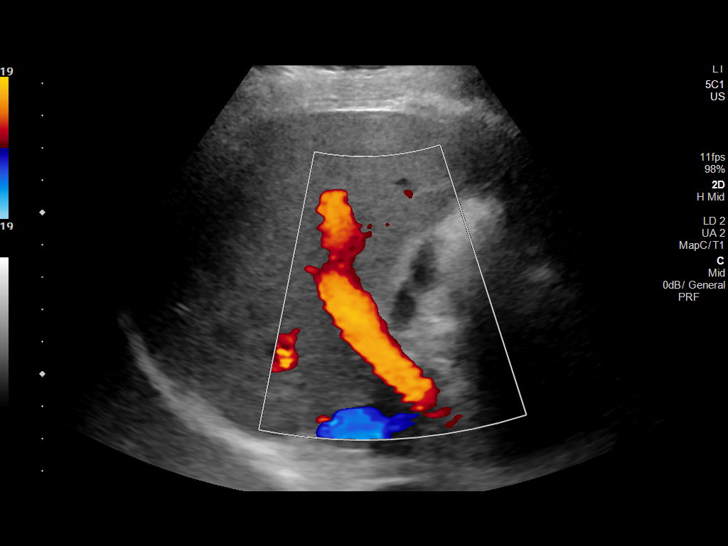

[14 of 25 positions shown; findings below may reference images not displayed]

FINDINGS: Gallbladder:

No gallstones or wall thickening visualized. No sonographic Murphy
sign noted by sonographer.

Common bile duct:

Diameter: 3 mm

Liver:

No focal lesion identified. Within normal limits in parenchymal
echogenicity. Portal vein is patent on color Doppler imaging with
normal direction of blood flow towards the liver.

Other: None.
IMPRESSION: Unremarkable right upper quadrant ultrasound.

## 2020-12-27 DIAGNOSIS — Z85828 Personal history of other malignant neoplasm of skin: Secondary | ICD-10-CM | POA: Diagnosis not present

## 2020-12-27 DIAGNOSIS — L570 Actinic keratosis: Secondary | ICD-10-CM | POA: Diagnosis not present

## 2021-01-12 ENCOUNTER — Telehealth: Payer: Self-pay

## 2021-01-12 ENCOUNTER — Emergency Department (HOSPITAL_COMMUNITY)
Admission: EM | Admit: 2021-01-12 | Discharge: 2021-01-12 | Disposition: A | Discharge status: Home / Self Care | Payer: BC Managed Care – PPO | Attending: Emergency Medicine | Admitting: Emergency Medicine

## 2021-01-12 ENCOUNTER — Emergency Department (HOSPITAL_COMMUNITY): Payer: BC Managed Care – PPO

## 2021-01-12 ENCOUNTER — Other Ambulatory Visit: Payer: Self-pay

## 2021-01-12 DIAGNOSIS — R1031 Right lower quadrant pain: Secondary | ICD-10-CM | POA: Diagnosis not present

## 2021-01-12 DIAGNOSIS — R55 Syncope and collapse: Secondary | ICD-10-CM | POA: Insufficient documentation

## 2021-01-12 DIAGNOSIS — N9489 Other specified conditions associated with female genital organs and menstrual cycle: Secondary | ICD-10-CM | POA: Insufficient documentation

## 2021-01-12 DIAGNOSIS — I10 Essential (primary) hypertension: Secondary | ICD-10-CM | POA: Insufficient documentation

## 2021-01-12 DIAGNOSIS — Z85038 Personal history of other malignant neoplasm of large intestine: Secondary | ICD-10-CM | POA: Diagnosis not present

## 2021-01-12 DIAGNOSIS — N3001 Acute cystitis with hematuria: Secondary | ICD-10-CM | POA: Insufficient documentation

## 2021-01-12 DIAGNOSIS — I700 Atherosclerosis of aorta: Secondary | ICD-10-CM | POA: Diagnosis not present

## 2021-01-12 DIAGNOSIS — R531 Weakness: Secondary | ICD-10-CM | POA: Diagnosis not present

## 2021-01-12 DIAGNOSIS — K219 Gastro-esophageal reflux disease without esophagitis: Secondary | ICD-10-CM | POA: Diagnosis not present

## 2021-01-12 DIAGNOSIS — Z20822 Contact with and (suspected) exposure to covid-19: Secondary | ICD-10-CM | POA: Diagnosis not present

## 2021-01-12 DIAGNOSIS — D3500 Benign neoplasm of unspecified adrenal gland: Secondary | ICD-10-CM | POA: Diagnosis not present

## 2021-01-12 DIAGNOSIS — N200 Calculus of kidney: Secondary | ICD-10-CM | POA: Diagnosis not present

## 2021-01-12 DIAGNOSIS — R188 Other ascites: Secondary | ICD-10-CM | POA: Diagnosis not present

## 2021-01-12 DIAGNOSIS — R6883 Chills (without fever): Secondary | ICD-10-CM | POA: Diagnosis not present

## 2021-01-12 LAB — URINALYSIS, MICROSCOPIC (REFLEX)

## 2021-01-12 LAB — URINALYSIS, ROUTINE W REFLEX MICROSCOPIC
Bilirubin Urine: NEGATIVE
Glucose, UA: NEGATIVE mg/dL
Ketones, ur: 15 mg/dL — AB
Leukocytes,Ua: NEGATIVE
Nitrite: NEGATIVE
Protein, ur: NEGATIVE mg/dL
Specific Gravity, Urine: 1.015 (ref 1.005–1.030)
pH: 6 (ref 5.0–8.0)

## 2021-01-12 LAB — CBC
HCT: 39.5 % (ref 36.0–46.0)
Hemoglobin: 12.6 g/dL (ref 12.0–15.0)
MCH: 30 pg (ref 26.0–34.0)
MCHC: 31.9 g/dL (ref 30.0–36.0)
MCV: 94 fL (ref 80.0–100.0)
Platelets: 263 10*3/uL (ref 150–400)
RBC: 4.2 MIL/uL (ref 3.87–5.11)
RDW: 12.3 % (ref 11.5–15.5)
WBC: 4.7 10*3/uL (ref 4.0–10.5)
nRBC: 0 % (ref 0.0–0.2)

## 2021-01-12 LAB — COMPREHENSIVE METABOLIC PANEL
ALT: 25 U/L (ref 0–44)
AST: 21 U/L (ref 15–41)
Albumin: 3.6 g/dL (ref 3.5–5.0)
Alkaline Phosphatase: 49 U/L (ref 38–126)
Anion gap: 6 (ref 5–15)
BUN: 9 mg/dL (ref 6–20)
CO2: 25 mmol/L (ref 22–32)
Calcium: 8.8 mg/dL — ABNORMAL LOW (ref 8.9–10.3)
Chloride: 108 mmol/L (ref 98–111)
Creatinine, Ser: 0.64 mg/dL (ref 0.44–1.00)
GFR, Estimated: >60 mL/min (ref >60)
Glucose, Bld: 99 mg/dL (ref 70–99)
Potassium: 3.9 mmol/L (ref 3.5–5.1)
Sodium: 139 mmol/L (ref 135–145)
Total Bilirubin: 0.6 mg/dL (ref 0.3–1.2)
Total Protein: 6.5 g/dL (ref 6.5–8.1)

## 2021-01-12 LAB — I-STAT BETA HCG BLOOD, ED (MC, WL, AP ONLY): I-stat hCG, quantitative: <5 m[IU]/mL (ref <5)

## 2021-01-12 LAB — RESP PANEL BY RT-PCR (FLU A&B, COVID) ARPGX2
Influenza A by PCR: NEGATIVE
Influenza B by PCR: NEGATIVE
SARS Coronavirus 2 by RT PCR: NEGATIVE

## 2021-01-12 LAB — LIPASE, BLOOD: Lipase: 24 U/L (ref 11–51)

## 2021-01-12 MED ORDER — SODIUM CHLORIDE 0.9 % IV BOLUS
1000.0000 mL | Freq: Once | INTRAVENOUS | Status: AC
  Administered 2021-01-12: 1000 mL via INTRAVENOUS

## 2021-01-12 MED ORDER — KETOROLAC TROMETHAMINE 15 MG/ML IJ SOLN
15.0000 mg | Freq: Once | INTRAMUSCULAR | Status: AC
  Administered 2021-01-12: 15 mg via INTRAVENOUS
  Filled 2021-01-12: qty 1

## 2021-01-12 MED ORDER — CEFADROXIL 500 MG PO CAPS: 500.0000 mg | ORAL_CAPSULE | Freq: Two times a day (BID) | ORAL | 0 refills | Status: DC

## 2021-01-12 NOTE — Telephone Encounter (Signed)
[facility] - Client TELEPHONE ADVICE RECORD [facility] Patient Name: Rachel Cabrera Gender: Female DOB: 1965/05/06 Age: 55 Y 71 M 3 D Return Phone Number: [phone] (Primary), [phone] (Secondary) Address: City/ State/ Zip: [place] [address] Client [facility] - Client Client Site [facility] [place] - Day Provider Alma Friendly - NP Contact Type Call Who Is Calling Patient / Member / Family / Caregiver Call Type Triage / Clinical Relationship To Patient Self Return Phone Number [phone] (Primary) Chief Complaint Blood Pressure Low Reason for Call Symptomatic / Request for [place] states her blood pressure is low. 92/81. Translation No Nurse Assessment Nurse: Alveta Heimlich, RN, Rise Paganini Date/Time (Eastern Time): 01/12/2021 8:30:15 AM Confirm and document reason for call. If symptomatic, describe symptoms. ---Caller states she has blood pressure 92/81. This started early monday morning when she passed out on way to bathroom to have diarrhea. The BP was 70/40 then. She still has a little diarrhea, joint pain in knees, arms, stomach pain, lower leg pain and feels tired, weak and generally unwell. No dizziness. They called the fire department and told them what was going on and they told them it was probably the flu. No fever. Stomach pain is constant. It is the right side lower. It has been going on a week. Does the patient have any new or worsening symptoms? ---Yes Will a triage be completed? ---Yes Related visit to physician within the last 2 weeks? ---No Does the PT have any chronic conditions? (i.e. diabetes, asthma, this includes High risk factors for pregnancy, etc.) ---No Is the patient pregnant or possibly pregnant? (Ask all females between the ages of 69-55) ---No Is this a behavioral health or substance abuse call? ---No Guidelines Guideline  Title Affirmed Question Affirmed Notes Nurse Date/Time Eilene Ghazi Time) Abdominal Pain - Female [1] MILDMODERATE pain AND [2] constant Alveta Heimlich, RN, Rise Paganini 01/12/2021 8:35:56 AM PLEASE NOTE: All timestamps contained within this report are represented as [place] Standard Time. CONFIDENTIALTY NOTICE: This fax transmission is intended only for the addressee. It contains information that is legally privileged, confidential or otherwise protected from use or disclosure. If you are not the intended recipient, you are strictly prohibited from reviewing, disclosing, copying using or disseminating any of this information or taking any action in reliance on or regarding this information. If you have received this fax in error, please notify us immediately by telephone so that we can arrange for its return to [place]. Phone: [phone], Toll-Free: [phone], Fax: [phone] Page: 2 of 2 Call Id: [id number] Guidelines Guideline Title Affirmed Question Affirmed Notes Nurse Date/Time Eilene Ghazi Time) AND [3] present > 2 hours Blood Pressure - Low Abdominal pain Myer Haff 01/12/2021 8:37:48 AM Disp. Time Eilene Ghazi Time) Disposition Final User 01/12/2021 8:27:52 AM Send to Urgent Queue Ignacia Felling 01/12/2021 8:37:29 AM See HCP within 4 Hours (or PCP triage) Alveta Heimlich, RN, Rise Paganini 01/12/2021 8:38:50 AM Go to ED Now Yes Alveta Heimlich, RN, Ali Lowe Disagree/Comply Comply Caller Understands Yes PreDisposition InappropriateToAsk Care Advice Given Per Guideline SEE HCP (OR PCP TRIAGE) WITHIN 4 HOURS: * IF OFFICE WILL BE OPEN: You need to be seen within the next 3 or 4 hours. Call your doctor (or NP/PA) now or as soon as the office opens. CARE ADVICE given per Abdominal Pain, Female (Adult) guideline. CALL BACK IF: * You become worse REST: * Lie down. NOTHING BY MOUTH: * Do not eat or drink anything for now. GO TO  ED NOW: * Another adult should drive. * If immediate transportation is not available via car,  rideshare (e.g., Lyft, Uber), or taxi, then the patient should be instructed to call [id number]. CARE ADVICE given per Low Blood Pressure (Adult) guideline. Referrals [facility]

## 2021-01-12 NOTE — Telephone Encounter (Signed)
Noted, patient has been triaged at [facility]. Will await notes.

## 2021-01-12 NOTE — Discharge Instructions (Signed)
As we discussed your work-up today was reassuring, there is no worrisome causes of your recent syncope, low blood pressure.  Your urinalysis is significant for possible early UTI, which with your tenderness above your bladder makes me think that you may have an early UTI going on.  We prescribed you some antibiotics, please take the entire course.  Please follow-up with your doctor for further evaluation of your blood pressure if you continue to have episodes of low blood pressure.  Your blood pressure was stable during your visit today.

## 2021-01-12 NOTE — Telephone Encounter (Signed)
Per chart review tab pt is presently at [facility] ED. Sending note to Gentry Fitz and [place] CMA.

## 2021-01-12 NOTE — ED Provider Notes (Signed)
Emergency Medicine Provider Triage Evaluation Note  Rachel Cabrera , a 55 y.o. female  was evaluated in triage.  Pt complains of RLQ/lower abdominal pain for the past three days. Initially the patient had diarrhea on Monday with a syncopal episode on the toilet. Has not had any since.  Denies any fevers or urinary complaints.  Patient's urinating at least 3 times a day.  Denies any blood in her stool.  She reports the pain is in her right flank, radiates down into her right lower groin.  She reports she has had a history of kidney stones, but does not feel similar to this.The patient reports she has had BP systolically in the 94-80 range which is not typical for her. She was sent in by her PCP for further evaluation. Additionally, she complains of generalized joint pain.   Review of Systems  Positive: Abdominal pain, nausea, diarrhea, syncope Negative: Vaginal discharge, vaginal bleeding, vomiting, fevers, chest pain, shortness of breath  Physical Exam  BP 127/78   Pulse 72   Temp 98.6 F (37 C) (Oral)   Resp 18   SpO2 100%  Gen:   Awake, no distress   Resp:  Normal effort  MSK:   Moves extremities without difficulty  Other:  Abdomen soft.  Mild tenderness to the right lower quadrant and suprapubic area.  No overlying skin changes noted.  No guarding or rebounding.  Medical Decision Making  Medically screening exam initiated at 11:56 AM.  Appropriate orders placed.  Steph T Cuffe was informed that the remainder of the evaluation will be completed by another provider, this initial triage assessment does not replace that evaluation, and the importance of remaining in the ED until their evaluation is complete.  Labs and imaging ordered.    Sherrell Puller, PA-C 01/12/21 1201    Elnora Morrison, MD 01/13/21 [phone]

## 2021-01-12 NOTE — ED Provider Notes (Signed)
[facility] EMERGENCY DEPARTMENT Provider Note   CSN: [id number] Arrival date & time: 01/12/21  4193     History Chief Complaint  Patient presents with   Abdominal Pain    Rachel Cabrera is a 55 y.o. female with past medical history significant for hypertension, hyperlipidemia, migraines who presents with 2 days of lower abdominal pain, right lower quadrant pain and, weakness, low blood pressure.  Patient reports that she had a syncopal episode 2 days ago, while straining on the toilet with some diarrhea.  Patient reports that she has had some aches and pains, nausea without vomiting.  Patient denies any cough, sore throat.  Patient does endorse some chills.  Patient denies any dysuria, hematuria, frequency, urgency.  Patient does also endorse a headache that is begun since she has been waiting, she reports that headache came on gradually, is frontal in nature, not associate with any vision changes, weakness.  Patient reports that she is adopted, does not know much of her family history, father died of heart attack, no history of sudden cardiac death that she knows of.   Abdominal Pain Associated symptoms: chills       Past Medical History:  Diagnosis Date   GERD (gastroesophageal reflux disease)    Hyperlipidemia    Hypertension    Migraines    Night sweat    PMDD (premenstrual dysphoric disorder)    UTI (lower urinary tract infection)     Patient Active Problem List   Diagnosis Date Noted   Flank pain 11/05/2019   Acute neck pain 04/15/2018   Elevated blood pressure reading 03/27/2018   Migraines 03/27/2018   Encounter for screening colonoscopy    Polyp of sigmoid colon    Benign neoplasm of descending colon    Benign neoplasm of ascending colon    Hyperlipidemia 10/03/2017   Vitamin D deficiency 09/21/2016    Past Surgical History:  Procedure Laterality Date   CESAREAN SECTION  1989, 1992   COLONOSCOPY WITH PROPOFOL N/A 11/06/2017   Procedure:  COLONOSCOPY WITH PROPOFOL;  Surgeon: Lucilla Lame, MD;  Location: [facility] ENDOSCOPY;  Service: Endoscopy;  Laterality: N/A;   TUBAL LIGATION  2004     OB History     Gravida  2   Para  2   Term  2   Preterm      AB      Living  2      SAB      IAB      Ectopic      Multiple      Live Births  2           Family History  Adopted: Yes  Problem Relation Age of Onset   Diabetes Father    Heart attack Father 59   Hypertension Father    Healthy Mother     Social History   Tobacco Use   Smoking status: Never   Smokeless tobacco: Never  Vaping Use   Vaping Use: Never used  Substance Use Topics   Alcohol use: Yes    Comment: occasional   Drug use: No    Home Medications Prior to Admission medications   Medication Sig Start Date End Date Taking? Authorizing Provider  cefadroxil (DURICEF) 500 MG capsule Take 1 capsule (500 mg total) by mouth 2 (two) times daily. 01/12/21  Yes Aleighna Wojtas H, PA-C  amoxicillin (AMOXIL) 875 MG tablet Take 1 tablet (875 mg total) by mouth 2 (two) times daily. 06/13/20  Scot Jun, FNP  cyclobenzaprine (FLEXERIL) 5 MG tablet Take 1-2 tablets (5-10 mg total) by mouth 3 (three) times daily as needed for muscle spasms. 09/24/18   Shambley, Melody N, CNM  Multiple Vitamins-Minerals (MULTIVITAMIN ADULT, MINERALS,) TABS Take 1 tablet by mouth daily.    [provider]  Oxymetazoline HCl (AFRIN NODRIP SINUS NA) Place into the nose.    [provider]  promethazine-dextromethorphan (PROMETHAZINE-DM) 6.25-15 MG/5ML syrup Take 5 mLs by mouth 4 (four) times daily as needed for cough. 06/13/20   Scot Jun, FNP  PSEUDOEPHEDRINE HCL PO Take by mouth.    [provider]    Allergies    Patient has no known allergies.  Review of Systems   Review of Systems  Constitutional:  Positive for chills.  Gastrointestinal:  Positive for abdominal pain.  Neurological:  Positive for syncope and headaches.   All other systems reviewed and are negative.  Physical Exam Updated Vital Signs BP 114/70 (BP Location: Left Arm)   Pulse 81   Temp 98.6 F (37 C) (Oral)   Resp 18   SpO2 100%   Physical Exam Vitals and nursing note reviewed.  Constitutional:      General: She is not in acute distress.    Appearance: Normal appearance.  HENT:     Head: Normocephalic and atraumatic.  Eyes:     General:        Right eye: No discharge.        Left eye: No discharge.  Cardiovascular:     Rate and Rhythm: Normal rate and regular rhythm.     Heart sounds: No murmur heard.   No friction rub. No gallop.  Pulmonary:     Effort: Pulmonary effort is normal.     Breath sounds: Normal breath sounds.  Abdominal:     General: Bowel sounds are normal.     Palpations: Abdomen is soft.     Comments: Some tenderness to palpation in the right flank, and suprapubic region.  No rebound, rigidity, guarding.  Normal bowel sounds throughout.  Musculoskeletal:     Comments: Intact strength 5 out of 5 bilateral upper and lower extremities.  Alert and oriented x3.  Romberg negative, gait normal.  Skin:    General: Skin is warm and dry.     Capillary Refill: Capillary refill takes less than 2 seconds.  Neurological:     Mental Status: She is alert and oriented to person, place, and time.     Comments: Cranial nerves III through XII grossly intact.  Psychiatric:        Mood and Affect: Mood normal.        Behavior: Behavior normal.    ED Results / Procedures / Treatments   Labs (all labs ordered are listed, but only abnormal results are displayed) Labs Reviewed  COMPREHENSIVE METABOLIC PANEL - Abnormal; Notable for the following components:      Result Value   Calcium 8.8 (*)    All other components within normal limits  URINALYSIS, ROUTINE W REFLEX MICROSCOPIC - Abnormal; Notable for the following components:   Hgb urine dipstick MODERATE (*)    Ketones, ur 15 (*)    All other components within normal  limits  URINALYSIS, MICROSCOPIC (REFLEX) - Abnormal; Notable for the following components:   Bacteria, UA MANY (*)    All other components within normal limits  RESP PANEL BY RT-PCR (FLU A&B, COVID) ARPGX2  LIPASE, BLOOD  CBC  I-STAT BETA HCG BLOOD,  ED ([facility], [facility], [facility] ONLY)    EKG None  Radiology CT Renal Stone Study  Result Date: 01/12/2021 CLINICAL DATA:  Right lower quadrant and right flank pain history of kidney stones EXAM: CT ABDOMEN AND PELVIS WITHOUT CONTRAST TECHNIQUE: Multidetector CT imaging of the abdomen and pelvis was performed following the standard protocol without IV contrast. COMPARISON:  12/07/2014 FINDINGS: Lower chest: No acute abnormality. Hepatobiliary: No solid liver abnormality is seen. No gallstones, gallbladder wall thickening, or biliary dilatation. Pancreas: Unremarkable. No pancreatic ductal dilatation or surrounding inflammatory changes. Spleen: Normal in size without significant abnormality. Adrenals/Urinary Tract: Stable, definitively benign fat containing left adrenal adenoma (series 3, image 16). Punctuate bilateral nonobstructive renal calculi. No ureteral calculi or hydronephrosis. Bladder is unremarkable. Stomach/Bowel: Stomach is within normal limits. Appendix is not clearly visualized. No evidence of bowel wall thickening, distention, or inflammatory changes. Vascular/Lymphatic: Aortic atherosclerosis. No enlarged abdominal or pelvic lymph nodes. Reproductive: No mass or other significant abnormality. Other: No abdominal wall hernia or abnormality. Trace ascites in the low pelvis, similar to prior examination (series 3, image 66). Musculoskeletal: No acute or significant osseous findings. IMPRESSION: 1. Punctuate bilateral nonobstructive renal calculi. No ureteral calculi or hydronephrosis. 2. The appendix is not clearly visualized. There are no secondary inflammatory findings in the right lower quadrant. 3. Trace ascites in the low pelvis, similar to prior  examination, of uncertain significance. Aortic Atherosclerosis (ICD10-I70.0). Electronically Signed   By: Delanna Ahmadi M.D.   On: 01/12/2021 12:56    Procedures Procedures   Medications Ordered in ED Medications  sodium chloride 0.9 % bolus 1,000 mL (0 mLs Intravenous Stopped 01/12/21 1905)  ketorolac (TORADOL) 15 MG/ML injection 15 mg (15 mg Intravenous Given 01/12/21 1751)    ED Course  I have reviewed the triage vital signs and the nursing notes.  Pertinent labs & imaging results that were available during my care of the patient were reviewed by me and considered in my medical decision making (see chart for details).    MDM Rules/Calculators/A&P                         Overall well appearing patient with the complaint of weakness, malaise, and recent syncope on the toilet followed by low blood pressure.  Patient with new headache, that was gradual onset while she was waiting for a room.  Patient's report of syncope while sitting on the toilet without any repeat of this event sounds like probable vasovagal syncope.  Will obtain EKG to assess for arrhythmias, ischemia, conduction abnormalities.  Patient with poorly known family history secondary to adoption, however denies known sudden cardiac death in family.  Patient with improvement of pain, feelings of weakness, and headache with fluids and Toradol.  EKG normal sinus rhythm no evidence of ischemia, arrhythmia, or conduction abnormalities.  Based on patient's story strong suspicion for vasovagal syncope.  Unsure what caused several days of slightly low blood pressures after that, possible faulty home cuff, blood pressure has been stable throughout the entirety of her visit.  CT scan does not show any evidence of kidney stone, however urinalysis does show many bacteria, positive hemoglobin, as well as patient has suprapubic tenderness on exam.  Could represent early UTI, discussed with patient will presumptively treat with short course of  antibiotics.  Encouraged follow-up with primary care doctor at patient's earliest convenience for further evaluation.  Return precautions given, patient discharged in stable condition. Final Clinical Impression(s) / ED Diagnoses Final diagnoses:  Vasovagal syncope  Acute cystitis with hematuria    Rx / DC Orders ED Discharge Orders          Ordered    cefadroxil (DURICEF) 500 MG capsule  2 times daily        01/12/21 [address]             Anselmo Pickler, [place] 01/12/21 1924    Valarie Merino, MD 01/16/21 [id number]

## 2021-01-12 NOTE — ED Triage Notes (Signed)
Pt BIB POV, after pt's PCP recommended that pt come to ED r/t to increase abdominal and BIL leg pain x 2 days. Pt states that she had witnessed syncopal episode by spouse while using on the commode  after several episodes of diarrhea.

## 2021-02-15 DIAGNOSIS — Z1231 Encounter for screening mammogram for malignant neoplasm of breast: Secondary | ICD-10-CM | POA: Diagnosis not present

## 2021-02-15 DIAGNOSIS — Z01419 Encounter for gynecological examination (general) (routine) without abnormal findings: Secondary | ICD-10-CM | POA: Diagnosis not present

## 2021-02-15 DIAGNOSIS — Z6827 Body mass index (BMI) 27.0-27.9, adult: Secondary | ICD-10-CM | POA: Diagnosis not present

## 2021-02-15 DIAGNOSIS — N951 Menopausal and female climacteric states: Secondary | ICD-10-CM | POA: Diagnosis not present

## 2021-03-14 DIAGNOSIS — Z85828 Personal history of other malignant neoplasm of skin: Secondary | ICD-10-CM | POA: Diagnosis not present

## 2021-03-14 DIAGNOSIS — Z86018 Personal history of other benign neoplasm: Secondary | ICD-10-CM | POA: Diagnosis not present

## 2021-03-14 DIAGNOSIS — C44712 Basal cell carcinoma of skin of right lower limb, including hip: Secondary | ICD-10-CM | POA: Diagnosis not present

## 2021-03-14 DIAGNOSIS — D2330 Other benign neoplasm of skin of unspecified part of face: Secondary | ICD-10-CM | POA: Diagnosis not present

## 2021-03-14 DIAGNOSIS — D485 Neoplasm of uncertain behavior of skin: Secondary | ICD-10-CM | POA: Diagnosis not present

## 2021-04-11 ENCOUNTER — Encounter: Payer: Self-pay | Admitting: Family

## 2021-04-11 ENCOUNTER — Other Ambulatory Visit: Payer: Self-pay

## 2021-04-11 ENCOUNTER — Ambulatory Visit: Payer: BC Managed Care – PPO | Admitting: Family

## 2021-04-11 VITALS — BP 126/88 | HR 72 | Temp 98.3°F | Ht 60.0 in | Wt 144.0 lb

## 2021-04-11 DIAGNOSIS — H109 Unspecified conjunctivitis: Secondary | ICD-10-CM | POA: Insufficient documentation

## 2021-04-11 DIAGNOSIS — B9689 Other specified bacterial agents as the cause of diseases classified elsewhere: Secondary | ICD-10-CM

## 2021-04-11 MED ORDER — OFLOXACIN 0.3 % OP SOLN: 1.0000 [drp] | Freq: Four times a day (QID) | OPHTHALMIC | 0 refills | Status: AC

## 2021-04-11 NOTE — Assessment & Plan Note (Signed)
rx for ofloxacin sent to pt pharmacy, take as prescribed.  Discussed how to wash eye appropriately with warm warm cloth from inner to outer canthus. Wash bed sheets and pillow cases to prevent re-infection. Frequent handwashing. If no improvement in 24-48 hours with eye drops as prescribed, please call office.   

## 2021-04-11 NOTE — Progress Notes (Signed)
Established Patient Office Visit  Subjective:  Patient ID: Rachel Cabrera, female    DOB: [birth year]  Age: 56 y.o. MRN: [id number]  CC:  Chief Complaint  Patient presents with   Eye Problem    Pt stated---right eye, itching, burning, swollen, yellow discharge--3 day. Tried ibuprofen and antihistamine.    HPI Rachel Cabrera is here today with concerns.   Three days ago early Saturday am awoke with right eye itching, and irritation. The next day with lid swelling and redness. She tried ibuprofen and some benadryl with no real relief. Crusting in the am, with yellow discharge. Left eye is starting to itch as well on top of the lid.   No eye pain. Gritty sensation in the eye.  No change in vision.   Past Medical History:  Diagnosis Date   GERD (gastroesophageal reflux disease)    Hyperlipidemia    Hypertension    Migraines    Night sweat    PMDD (premenstrual dysphoric disorder)    UTI (lower urinary tract infection)     Past Surgical History:  Procedure Laterality Date   CESAREAN SECTION  1989, 1992   COLONOSCOPY WITH PROPOFOL N/A 11/06/2017   Procedure: COLONOSCOPY WITH PROPOFOL;  Surgeon: Lucilla Lame, MD;  Location: [facility] ENDOSCOPY;  Service: Endoscopy;  Laterality: N/A;   TUBAL LIGATION  2004    Family History  Adopted: Yes  Problem Relation Age of Onset   Diabetes Father    Heart attack Father 14   Hypertension Father    Healthy Mother     Social History   Socioeconomic History   Marital status: Single    Spouse name: Not on file   Number of children: Not on file   Years of education: Not on file   Highest education level: Not on file  Occupational History   Not on file  Tobacco Use   Smoking status: Never   Smokeless tobacco: Never  Vaping Use   Vaping Use: Never used  Substance and Sexual Activity   Alcohol use: Yes    Comment: occasional   Drug use: No   Sexual activity: Yes    Birth control/protection: Surgical  Other Topics Concern   Not on  file  Social History Narrative   Divorced. Engaged.   2 children.   Works in Insurance underwriter.   Enjoys riding motorcycles, camping.   Social Determinants of Health   Financial Resource Strain: Not on file  Food Insecurity: Not on file  Transportation Needs: Not on file  Physical Activity: Not on file  Stress: Not on file  Social Connections: Not on file  Intimate Partner Violence: Not on file    Outpatient Medications Prior to Visit  Medication Sig Dispense Refill   estradiol (VIVELLE-DOT) 0.1 MG/24HR patch Place 1 patch onto the skin 2 (two) times a week.     Multiple Vitamins-Minerals (MULTIVITAMIN ADULT, MINERALS,) TABS Take 1 tablet by mouth daily.     progesterone (PROMETRIUM) 100 MG capsule Take 100 mg by mouth daily.     Oxymetazoline HCl (AFRIN NODRIP SINUS NA) Place into the nose.     amoxicillin (AMOXIL) 875 MG tablet Take 1 tablet (875 mg total) by mouth 2 (two) times daily. 20 tablet 0   cefadroxil (DURICEF) 500 MG capsule Take 1 capsule (500 mg total) by mouth 2 (two) times daily. 14 capsule 0   cyclobenzaprine (FLEXERIL) 5 MG tablet Take 1-2 tablets (5-10 mg total) by mouth 3 (three) times daily as needed  for muscle spasms. 30 tablet 0   promethazine-dextromethorphan (PROMETHAZINE-DM) 6.25-15 MG/5ML syrup Take 5 mLs by mouth 4 (four) times daily as needed for cough. 140 mL 0   PSEUDOEPHEDRINE HCL PO Take by mouth.     No facility-administered medications prior to visit.    No Known Allergies  ROS Review of Systems  Constitutional:  Negative for chills and fever.  HENT:  Negative for congestion, ear pain, sinus pressure and sore throat.   Eyes:  Positive for discharge and itching. Negative for pain.  Respiratory:  Negative for cough, shortness of breath and wheezing.   Cardiovascular:  Negative for chest pain and palpitations.     Objective:    Physical Exam Vitals reviewed.  Constitutional:      General: She is not in acute distress.    Appearance: Normal  appearance. She is normal weight. She is not ill-appearing, toxic-appearing or diaphoretic.  HENT:     Right Ear: Tympanic membrane normal.     Left Ear: Tympanic membrane normal.     Nose: Nose normal.     Mouth/Throat:     Mouth: Mucous membranes are moist.  Eyes:     General:        Right eye: Discharge (right eye discharge, yellow discharge) present.        Left eye: Discharge (left eye itching) present.    Extraocular Movements: Extraocular movements intact.     Right eye: Normal extraocular motion.     Left eye: Normal extraocular motion.     Conjunctiva/sclera:     Right eye: Right conjunctiva is injected.     Pupils: Pupils are equal, round, and reactive to light.     Comments: Right eye upper lid swelling, mild lower lid swelling   Cardiovascular:     Rate and Rhythm: Normal rate.  Neurological:     Mental Status: She is alert.    BP 126/88    Pulse 72    Temp 98.3 F (36.8 C)    Ht 5' (1.524 m)    Wt 144 lb (65.3 kg)    SpO2 99%    BMI 28.12 kg/m  Wt Readings from Last 3 Encounters:  04/11/21 144 lb (65.3 kg)  05/20/20 136 lb 12 oz (62 kg)  11/21/19 132 lb 4.8 oz (60 kg)     Health Maintenance Due  Topic Date Due   HIV Screening  Never done   Hepatitis C Screening  Never done   TETANUS/TDAP  Never done   MAMMOGRAM  Never done   Zoster Vaccines- Shingrix (1 of 2) Never done   PAP SMEAR-Modifier  09/15/2019   COVID-19 Vaccine (3 - Booster for Moderna series) 04/23/2020   INFLUENZA VACCINE  Never done   COLONOSCOPY (Pts 45-96yrs Insurance coverage will need to be confirmed)  11/06/2020    There are no preventive care reminders to display for this patient.  Lab Results  Component Value Date   TSH 1.730 11/24/2019   Lab Results  Component Value Date   WBC 4.7 01/12/2021   HGB 12.6 01/12/2021   HCT 39.5 01/12/2021   MCV 94.0 01/12/2021   PLT 263 01/12/2021   Lab Results  Component Value Date   NA 139 01/12/2021   K 3.9 01/12/2021   CO2 25  01/12/2021   GLUCOSE 99 01/12/2021   BUN 9 01/12/2021   CREATININE 0.64 01/12/2021   BILITOT 0.6 01/12/2021   ALKPHOS 49 01/12/2021   AST 21 01/12/2021   ALT  25 01/12/2021   PROT 6.5 01/12/2021   ALBUMIN 3.6 01/12/2021   CALCIUM 8.8 (L) 01/12/2021   ANIONGAP 6 01/12/2021   Lab Results  Component Value Date   HGBA1C 5.2 11/24/2019      Assessment & Plan:   Problem List Items Addressed This Visit       Other   Bacterial conjunctivitis of both eyes - Primary    rx for ofloxacin sent to pt pharmacy, take as prescribed.  Discussed how to wash eye appropriately with warm warm cloth from inner to outer canthus. Wash bed sheets and pillow cases to prevent re-infection. Frequent handwashing. If no improvement in 24-48 hours with eye drops as prescribed, please call office.        Relevant Medications   ofloxacin (OCUFLOX) 0.3 % ophthalmic solution    Meds ordered this encounter  Medications   ofloxacin (OCUFLOX) 0.3 % ophthalmic solution    Sig: Place 1 drop into both eyes 4 (four) times daily for 7 days.    Dispense:  1.4 mL    Refill:  0    Order Specific Question:   Supervising Provider    Answer:   BEDSOLE, AMY E [[id number]]    Follow-up: Return if symptoms worsen or fail to improve.    Eugenia Pancoast, FNP

## 2021-04-11 NOTE — Patient Instructions (Signed)
Eye drops were prescribed today and sent to your preferred pharmacy.  Start eye drop, and use as instructed.  When washing eye, wash from inner to outer canthus with warm wash cloth.  Wash bed sheets and pillow cases.  Throw out all old makeup, and try not to use makeup while duration of eye drops prescription.

## 2021-05-04 DIAGNOSIS — L988 Other specified disorders of the skin and subcutaneous tissue: Secondary | ICD-10-CM | POA: Diagnosis not present

## 2021-05-04 DIAGNOSIS — C44712 Basal cell carcinoma of skin of right lower limb, including hip: Secondary | ICD-10-CM | POA: Diagnosis not present

## 2021-06-17 DIAGNOSIS — H60512 Acute actinic otitis externa, left ear: Secondary | ICD-10-CM | POA: Diagnosis not present

## 2021-06-20 DIAGNOSIS — H9203 Otalgia, bilateral: Secondary | ICD-10-CM | POA: Diagnosis not present

## 2021-08-08 ENCOUNTER — Ambulatory Visit: Payer: BC Managed Care – PPO

## 2021-08-08 ENCOUNTER — Ambulatory Visit
Admission: EM | Admit: 2021-08-08 | Discharge: 2021-08-08 | Disposition: A | Discharge status: Home / Self Care | Payer: BC Managed Care – PPO | Attending: Family Medicine | Admitting: Family Medicine

## 2021-08-08 DIAGNOSIS — H6123 Impacted cerumen, bilateral: Secondary | ICD-10-CM

## 2021-08-08 DIAGNOSIS — J3089 Other allergic rhinitis: Secondary | ICD-10-CM | POA: Diagnosis not present

## 2021-08-08 MED ORDER — CETIRIZINE HCL 10 MG PO TABS: 10.0000 mg | ORAL_TABLET | Freq: Every day | ORAL | 2 refills | Status: DC

## 2021-08-08 MED ORDER — FLUTICASONE PROPIONATE 50 MCG/ACT NA SUSP: 1.0000 | Freq: Two times a day (BID) | NASAL | 2 refills | Status: AC

## 2021-08-08 MED ORDER — CARBAMIDE PEROXIDE 6.5 % OT SOLN: 5.0000 [drp] | Freq: Two times a day (BID) | OTIC | 0 refills | Status: AC

## 2021-08-08 MED ORDER — FLUCONAZOLE 150 MG PO TABS: 150.0000 mg | ORAL_TABLET | Freq: Once | ORAL | 0 refills | Status: AC

## 2021-08-08 MED ORDER — PREDNISONE 20 MG PO TABS: 40.0000 mg | ORAL_TABLET | Freq: Every day | ORAL | 0 refills | Status: DC

## 2021-08-08 NOTE — ED Triage Notes (Signed)
Pt presents with c/o nasal congestion for past few days but has had left ear pain since May 4. Pt was treated with amoxicillin then but is still having pain

## 2021-08-24 ENCOUNTER — Ambulatory Visit
Admission: RE | Admit: 2021-08-24 | Discharge: 2021-08-24 | Disposition: A | Discharge status: Home / Self Care | Payer: BC Managed Care – PPO | Source: Ambulatory Visit | Attending: Nurse Practitioner | Admitting: Nurse Practitioner

## 2021-08-24 VITALS — BP 138/78 | HR 85 | Temp 98.3°F | Resp 16

## 2021-08-24 DIAGNOSIS — R519 Headache, unspecified: Secondary | ICD-10-CM | POA: Diagnosis not present

## 2021-08-24 DIAGNOSIS — H6123 Impacted cerumen, bilateral: Secondary | ICD-10-CM | POA: Diagnosis not present

## 2021-08-24 DIAGNOSIS — H9202 Otalgia, left ear: Secondary | ICD-10-CM | POA: Diagnosis not present

## 2021-08-24 MED ORDER — AMOXICILLIN-POT CLAVULANATE 875-125 MG PO TABS: 1.0000 | ORAL_TABLET | Freq: Two times a day (BID) | ORAL | 0 refills | Status: AC

## 2021-08-24 NOTE — ED Provider Notes (Signed)
[facility]    CSN: [id number] Arrival date & time: 08/24/21  1002      History   Chief Complaint Chief Complaint  Patient presents with   Ear Drainage    Still having ear ache - Entered by patient   Appointment    1030    HPI Adelyna T Kalisz is a 56 y.o. female.   Patient presents with left sided ear pain ongoing for the past few weeks.  Reports initially, she was treated with ear drops then Amoxicillin prescribed by [facility].  At the end of June, was seen in urgent care and treated with prednisone, Debrox, flonase (made worse) and allergy medication.  She has taken all of the medicine without benefit.    Denies fevers, cough, congestion, sore throat, ear drainage, hearing loss.  Does not use Q-tips.   Past Medical History:  Diagnosis Date   GERD (gastroesophageal reflux disease)    Hyperlipidemia    Hypertension    Migraines    Night sweat    PMDD (premenstrual dysphoric disorder)    UTI (lower urinary tract infection)     Patient Active Problem List   Diagnosis Date Noted   Bacterial conjunctivitis of both eyes 04/11/2021   Flank pain 11/05/2019   Acute neck pain 04/15/2018   Elevated blood pressure reading 03/27/2018   Migraines 03/27/2018   Encounter for screening colonoscopy    Polyp of sigmoid colon    Benign neoplasm of descending colon    Benign neoplasm of ascending colon    Hyperlipidemia 10/03/2017   Vitamin D deficiency 09/21/2016    Past Surgical History:  Procedure Laterality Date   CESAREAN SECTION  1989, 1992   COLONOSCOPY WITH PROPOFOL N/A 11/06/2017   Procedure: COLONOSCOPY WITH PROPOFOL;  Surgeon: Lucilla Lame, MD;  Location: [facility] ENDOSCOPY;  Service: Endoscopy;  Laterality: N/A;   TUBAL LIGATION  2004    OB History     Gravida  2   Para  2   Term  2   Preterm      AB      Living  2      SAB      IAB      Ectopic      Multiple      Live Births  2            Home Medications    Prior to Admission  medications   Medication Sig Start Date End Date Taking? Authorizing Provider  amoxicillin-clavulanate (AUGMENTIN) 875-125 MG tablet Take 1 tablet by mouth 2 (two) times daily for 7 days. 08/24/21 08/31/21 Yes Eulogio Bear, NP  carbamide peroxide (DEBROX) 6.5 % OTIC solution Place 5 drops into both ears 2 (two) times daily. 08/08/21   Volney American, PA-C  cetirizine (ZYRTEC ALLERGY) 10 MG tablet Take 1 tablet (10 mg total) by mouth daily. 08/08/21   Volney American, PA-C  estradiol (VIVELLE-DOT) 0.1 MG/24HR patch Place 1 patch onto the skin 2 (two) times a week. 02/15/21   [provider]  fluticasone (FLONASE) 50 MCG/ACT nasal spray Place 1 spray into both nostrils 2 (two) times daily. 08/08/21   Volney American, PA-C  Multiple Vitamins-Minerals (MULTIVITAMIN ADULT, MINERALS,) TABS Take 1 tablet by mouth daily.    [provider]  progesterone (PROMETRIUM) 100 MG capsule Take 100 mg by mouth daily. 02/15/21   [provider]    Family History Family History  Adopted: Yes  Problem Relation Age of  Onset   Diabetes Father    Heart attack Father 91   Hypertension Father    Healthy Mother     Social History Social History   Tobacco Use   Smoking status: Never   Smokeless tobacco: Never  Vaping Use   Vaping Use: Never used  Substance Use Topics   Alcohol use: Yes    Comment: occasional   Drug use: No     Allergies   Patient has no known allergies.   Review of Systems Review of Systems Per HPI  Physical Exam Triage Vital Signs ED Triage Vitals  Enc Vitals Group     BP 08/24/21 1041 138/78     Pulse Rate 08/24/21 1041 85     Resp 08/24/21 1041 16     Temp 08/24/21 1041 98.3 F (36.8 C)     Temp Source 08/24/21 1041 Oral     SpO2 08/24/21 1041 98 %     Weight --      Height --      Head Circumference --      Peak Flow --      Pain Score 08/24/21 1040 6     Pain Loc --      Pain Edu? --      Excl. in [place]? --    No  data found.  Updated Vital Signs BP 138/78 (BP Location: Right Arm)   Pulse 85   Temp 98.3 F (36.8 C) (Oral)   Resp 16   LMP 10/31/2019   SpO2 98%   Visual Acuity Right Eye Distance:   Left Eye Distance:   Bilateral Distance:    Right Eye Near:   Left Eye Near:    Bilateral Near:     Physical Exam Vitals and nursing note reviewed.  Constitutional:      General: She is not in acute distress.    Appearance: Normal appearance. She is not toxic-appearing.  HENT:     Head: Normocephalic and atraumatic.     Right Ear: There is impacted cerumen.     Left Ear: There is impacted cerumen.     Nose: Nose normal. No congestion.     Mouth/Throat:     Mouth: Mucous membranes are moist.     Pharynx: Oropharynx is clear.  Pulmonary:     Effort: Pulmonary effort is normal. No respiratory distress.  Musculoskeletal:     Cervical back: Normal range of motion. Tenderness present.  Lymphadenopathy:     Cervical: No cervical adenopathy.  Skin:    General: Skin is warm and dry.     Capillary Refill: Capillary refill takes less than 2 seconds.     Coloration: Skin is not jaundiced or pale.     Findings: No erythema.  Neurological:     Mental Status: She is alert and oriented to person, place, and time.  Psychiatric:        Behavior: Behavior is cooperative.      UC Treatments / Results  Labs (all labs ordered are listed, but only abnormal results are displayed) Labs Reviewed - No data to display  EKG   Radiology No results found.  Procedures Ear Cerumen Removal  Date/Time: 08/24/2021 11:21 AM  Performed by: Eulogio Bear, NP Authorized by: Eulogio Bear, NP   Consent:    Consent obtained:  Verbal   Consent given by:  Patient   Risks, benefits, and alternatives were discussed: yes     Risks discussed:  Pain, incomplete removal, TM  perforation and dizziness   Alternatives discussed:  Alternative treatment Universal protocol:    Procedure explained and  questions answered to patient or proxy's satisfaction: yes     Patient identity confirmed:  Verbally with patient Procedure details:    Location:  L ear and R ear   Procedure type: curette     Procedure outcomes: unable to remove cerumen   Post-procedure details:    Inspection:  No bleeding and some cerumen remaining   Procedure completion:  Tolerated  (including critical care time)  Medications Ordered in UC Medications - No data to display  Initial Impression / Assessment and Plan / UC Course  I have reviewed the triage vital signs and the nursing notes.  Pertinent labs & imaging results that were available during my care of the patient were reviewed by me and considered in my medical decision making (see chart for details).    Patient is a very pleasant, well-appearing 56 year old female presenting for left ear pain and pressure today.  Initially, I used a curette to remove some of the more proximal earwax.  We then attempted ear lavage.  Ear lavage provided with full relief of cerumen of right ear.  Upon revisualization of right tympanic membrane, it was intact and slightly erythematous, nonbulging.  Left ear remained packed with cerumen, unable to visualize tympanic membrane and patient unable to tolerate further rinses.  We will empirically treat for medial ear infection with Augmentin twice daily for 7 days and encourage close follow-up with ENT: Handout given and contact information provided to schedule an appointment.  The patient was given the opportunity to ask questions.  All questions answered to their satisfaction.  The patient is in agreement to this plan.   Final Clinical Impressions(s) / UC Diagnoses   Final diagnoses:  Impacted cerumen, bilateral  Left ear pain  Left-sided face pain     Discharge Instructions      - Continue to Debrox drops to your left ear to help loosen the wax -Start the Augmentin twice daily for 7 days to treat any inner ear  infection -Follow-up with ENT in about 1 week if no better-contact information provided below     ED Prescriptions     Medication Sig Dispense Auth. Provider   amoxicillin-clavulanate (AUGMENTIN) 875-125 MG tablet Take 1 tablet by mouth 2 (two) times daily for 7 days. 14 tablet Eulogio Bear, NP      PDMP not reviewed this encounter.   Eulogio Bear, NP 08/24/21 1122

## 2021-08-24 NOTE — ED Triage Notes (Signed)
Pt reports bilateral ear pain; swelling left ear x 2 weeks. States she was seen for ear pain and is getting worse.

## 2021-08-24 NOTE — Discharge Instructions (Addendum)
-   Continue to Debrox drops to your left ear to help loosen the wax -Start the Augmentin twice daily for 7 days to treat any inner ear infection -Follow-up with ENT in about 1 week if no better-contact information provided below

## 2021-09-05 DIAGNOSIS — H6983 Other specified disorders of Eustachian tube, bilateral: Secondary | ICD-10-CM | POA: Diagnosis not present

## 2021-09-05 DIAGNOSIS — H6122 Impacted cerumen, left ear: Secondary | ICD-10-CM | POA: Diagnosis not present

## 2021-09-05 DIAGNOSIS — H93293 Other abnormal auditory perceptions, bilateral: Secondary | ICD-10-CM | POA: Diagnosis not present

## 2021-09-07 DIAGNOSIS — L500 Allergic urticaria: Secondary | ICD-10-CM | POA: Diagnosis not present

## 2021-09-08 DIAGNOSIS — H6983 Other specified disorders of Eustachian tube, bilateral: Secondary | ICD-10-CM | POA: Diagnosis not present

## 2021-10-01 ENCOUNTER — Other Ambulatory Visit: Payer: Self-pay | Admitting: Family Medicine

## 2021-10-03 NOTE — Telephone Encounter (Signed)
Urgent Care patient Requested Prescriptions  Pending Prescriptions Disp Refills   fluticasone (FLONASE) 50 MCG/ACT nasal spray [Pharmacy Med Name: FLUTICASONE PROP 50 MCG SPRAY] 48 mL     Sig: PLACE 1 SPRAY INTO BOTH NOSTRILS 2 (TWO) TIMES DAILY     There is no refill protocol information for this order      

## 2021-10-12 DIAGNOSIS — Z86018 Personal history of other benign neoplasm: Secondary | ICD-10-CM | POA: Diagnosis not present

## 2021-10-12 DIAGNOSIS — Z85828 Personal history of other malignant neoplasm of skin: Secondary | ICD-10-CM | POA: Diagnosis not present

## 2021-10-12 DIAGNOSIS — D485 Neoplasm of uncertain behavior of skin: Secondary | ICD-10-CM | POA: Diagnosis not present

## 2021-10-12 DIAGNOSIS — C44719 Basal cell carcinoma of skin of left lower limb, including hip: Secondary | ICD-10-CM | POA: Diagnosis not present

## 2021-10-12 DIAGNOSIS — L578 Other skin changes due to chronic exposure to nonionizing radiation: Secondary | ICD-10-CM | POA: Diagnosis not present

## 2021-11-18 ENCOUNTER — Ambulatory Visit
Admission: RE | Admit: 2021-11-18 | Discharge: 2021-11-18 | Disposition: A | Discharge status: Home / Self Care | Payer: BC Managed Care – PPO | Source: Ambulatory Visit | Attending: Family Medicine | Admitting: Family Medicine

## 2021-11-18 VITALS — BP 123/81 | HR 89 | Temp 98.0°F | Resp 16

## 2021-11-18 DIAGNOSIS — J069 Acute upper respiratory infection, unspecified: Secondary | ICD-10-CM

## 2021-11-18 DIAGNOSIS — H65192 Other acute nonsuppurative otitis media, left ear: Secondary | ICD-10-CM | POA: Diagnosis not present

## 2021-11-18 MED ORDER — PREDNISONE 50 MG PO TABS: ORAL_TABLET | ORAL | 0 refills | Status: DC

## 2021-11-18 NOTE — ED Triage Notes (Signed)
Sore throat, yellow greenish sputum with cough and nasal congestion since Monday.  Left ear pain that started this morning.  Has been taking a decongestion medication without relief.

## 2021-11-18 NOTE — Discharge Instructions (Signed)
Take the prednisone for the next 3 days, Flonase nasal spray twice daily, decongestants and cold and congestion medications.  You may try the [employer]Med sinus rinse kit several times daily to help prevent a sinus infection and clear your sinuses.  Keep taking your allergy medication daily.  Return for any worsening symptoms 

## 2021-11-18 NOTE — ED Provider Notes (Signed)
[facility]    CSN: [id number] Arrival date & time: 11/18/21  0941      History   Chief Complaint Chief Complaint  Patient presents with   Nasal Congestion    I have a cough with phelm, stuffy nose, left ear ache. Possible sinus infection. - Entered by patient    HPI Rachel Cabrera is a 56 y.o. female.   Patient presenting today with 5-day history of initially sore throat, then nasal congestion and now hacking cough.  Now was awoken in the middle the night with left ear pain and pressure.  Denies fever, chills, body aches, chest pain, shortness of breath, abdominal pain, nausea vomiting or diarrhea.  Taking over-the-counter decongestants with minimal relief.  Does have a history of seasonal allergies on antihistamines daily.    Past Medical History:  Diagnosis Date   GERD (gastroesophageal reflux disease)    Hyperlipidemia    Hypertension    Migraines    Night sweat    PMDD (premenstrual dysphoric disorder)    UTI (lower urinary tract infection)     Patient Active Problem List   Diagnosis Date Noted   Bacterial conjunctivitis of both eyes 04/11/2021   Flank pain 11/05/2019   Acute neck pain 04/15/2018   Elevated blood pressure reading 03/27/2018   Migraines 03/27/2018   Encounter for screening colonoscopy    Polyp of sigmoid colon    Benign neoplasm of descending colon    Benign neoplasm of ascending colon    Hyperlipidemia 10/03/2017   Vitamin D deficiency 09/21/2016    Past Surgical History:  Procedure Laterality Date   CESAREAN SECTION  1989, 1992   COLONOSCOPY WITH PROPOFOL N/A 11/06/2017   Procedure: COLONOSCOPY WITH PROPOFOL;  Surgeon: Lucilla Lame, MD;  Location: [facility] ENDOSCOPY;  Service: Endoscopy;  Laterality: N/A;   TUBAL LIGATION  2004    OB History     Gravida  2   Para  2   Term  2   Preterm      AB      Living  2      SAB      IAB      Ectopic      Multiple      Live Births  2            Home  Medications    Prior to Admission medications   Medication Sig Start Date End Date Taking? Authorizing Provider  predniSONE (DELTASONE) 50 MG tablet Take 1 tab daily with breakfast for 3 days 11/18/21  Yes Volney American, PA-C  carbamide peroxide (DEBROX) 6.5 % OTIC solution Place 5 drops into both ears 2 (two) times daily. 08/08/21   Volney American, PA-C  cetirizine (ZYRTEC ALLERGY) 10 MG tablet Take 1 tablet (10 mg total) by mouth daily. 08/08/21   Volney American, PA-C  estradiol (VIVELLE-DOT) 0.1 MG/24HR patch Place 1 patch onto the skin 2 (two) times a week. 02/15/21   [provider]  fluticasone (FLONASE) 50 MCG/ACT nasal spray Place 1 spray into both nostrils 2 (two) times daily. 08/08/21   Volney American, PA-C  Multiple Vitamins-Minerals (MULTIVITAMIN ADULT, MINERALS,) TABS Take 1 tablet by mouth daily.    [provider]  progesterone (PROMETRIUM) 100 MG capsule Take 100 mg by mouth daily. 02/15/21   [provider]    Family History Family History  Adopted: Yes  Problem Relation Age of Onset   Diabetes Father    Heart  attack Father 77   Hypertension Father    Healthy Mother     Social History Social History   Tobacco Use   Smoking status: Never   Smokeless tobacco: Never  Vaping Use   Vaping Use: Never used  Substance Use Topics   Alcohol use: Yes    Comment: occasional   Drug use: No     Allergies   Augmentin [amoxicillin-pot clavulanate]   Review of Systems Review of Systems Per HPI  Physical Exam Triage Vital Signs ED Triage Vitals  Enc Vitals Group     BP 11/18/21 0947 123/81     Pulse Rate 11/18/21 0947 89     Resp 11/18/21 0947 16     Temp 11/18/21 0947 98 F (36.7 C)     Temp Source 11/18/21 0947 Oral     SpO2 11/18/21 0947 99 %     Weight --      Height --      Head Circumference --      Peak Flow --      Pain Score 11/18/21 0948 6     Pain Loc --      Pain Edu? --      Excl. in [place]? --     No data found.  Updated Vital Signs BP 123/81 (BP Location: Right Arm)   Pulse 89   Temp 98 F (36.7 C) (Oral)   Resp 16   SpO2 99%   Visual Acuity Right Eye Distance:   Left Eye Distance:   Bilateral Distance:    Right Eye Near:   Left Eye Near:    Bilateral Near:     Physical Exam Vitals and nursing note reviewed.  Constitutional:      Appearance: Normal appearance. She is not ill-appearing.  HENT:     Head: Atraumatic.     Right Ear: Tympanic membrane normal.     Ears:     Comments: Left middle ear effusion    Nose: Rhinorrhea present.     Mouth/Throat:     Mouth: Mucous membranes are moist.     Pharynx: Oropharynx is clear. Posterior oropharyngeal erythema present.  Eyes:     Extraocular Movements: Extraocular movements intact.     Conjunctiva/sclera: Conjunctivae normal.  Cardiovascular:     Rate and Rhythm: Normal rate and regular rhythm.     Heart sounds: Normal heart sounds.  Pulmonary:     Effort: Pulmonary effort is normal.     Breath sounds: Normal breath sounds.  Musculoskeletal:        General: Normal range of motion.     Cervical back: Normal range of motion and neck supple.  Skin:    General: Skin is warm and dry.  Neurological:     Mental Status: She is alert and oriented to person, place, and time.     Motor: No weakness.     Gait: Gait normal.  Psychiatric:        Mood and Affect: Mood normal.        Thought Content: Thought content normal.        Judgment: Judgment normal.      UC Treatments / Results  Labs (all labs ordered are listed, but only abnormal results are displayed) Labs Reviewed - No data to display  EKG   Radiology No results found.  Procedures Procedures (including critical care time)  Medications Ordered in UC Medications - No data to display  Initial Impression / Assessment and Plan / UC  Course  I have reviewed the triage vital signs and the nursing notes.  Pertinent labs & imaging results that were  available during my care of the patient were reviewed by me and considered in my medical decision making (see chart for details).     Consistent with viral upper respiratory infection, treat with short course of prednisone, Flonase, Sudafed for the middle ear effusion and sinus pressure, discussed sinus rinses and other supportive measures in addition to over-the-counter cold and congestion medications.  Return for worsening symptoms.  Final Clinical Impressions(s) / UC Diagnoses   Final diagnoses:  Viral URI with cough  Acute middle ear effusion, left     Discharge Instructions      Take the prednisone for the next 3 days, Flonase nasal spray twice daily, decongestants and cold and congestion medications.  You may try the [employer]Med sinus rinse kit several times daily to help prevent a sinus infection and clear your sinuses.  Keep taking your allergy medication daily.  Return for any worsening symptoms    ED Prescriptions     Medication Sig Dispense Auth. Provider   predniSONE (DELTASONE) 50 MG tablet Take 1 tab daily with breakfast for 3 days 3 tablet Volney American, [place]      PDMP not reviewed this encounter.   Volney American, [place] 11/18/21 1142

## 2021-11-23 DIAGNOSIS — C4491 Basal cell carcinoma of skin, unspecified: Secondary | ICD-10-CM | POA: Diagnosis not present

## 2021-11-23 DIAGNOSIS — C44719 Basal cell carcinoma of skin of left lower limb, including hip: Secondary | ICD-10-CM | POA: Diagnosis not present

## 2022-01-03 DIAGNOSIS — M26609 Unspecified temporomandibular joint disorder, unspecified side: Secondary | ICD-10-CM | POA: Diagnosis not present

## 2022-01-03 DIAGNOSIS — H9202 Otalgia, left ear: Secondary | ICD-10-CM | POA: Diagnosis not present

## 2022-01-04 IMAGING — CT CT RENAL STONE PROTOCOL
2 of 4 series · 16 of 46 positions shown, 18 images · non-contrast
Comparison: 12/07/2014

CLINICAL DATA: Right lower quadrant and right flank pain history of
kidney stones

EXAM:
CT ABDOMEN AND PELVIS WITHOUT CONTRAST
TECHNIQUE: Multidetector CT imaging of the abdomen and pelvis was performed
following the standard protocol without IV contrast.

[Series 3: ap without · axial · non-contrast · 0.83mm/px · z∈[-455,-25]mm · 13 of 96 slices shown, 15 images]
[im 5/96  soft-tissue]
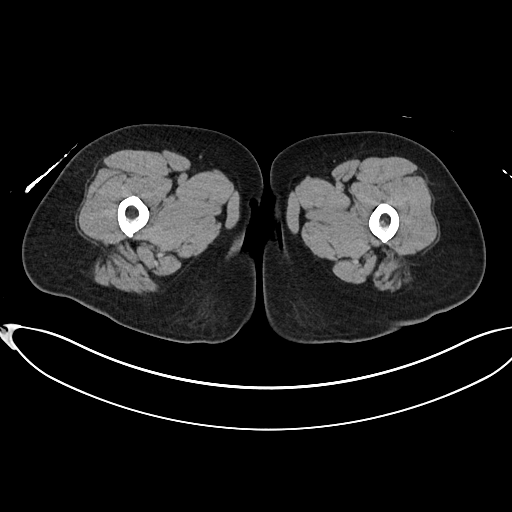
[im 5/96  bone]
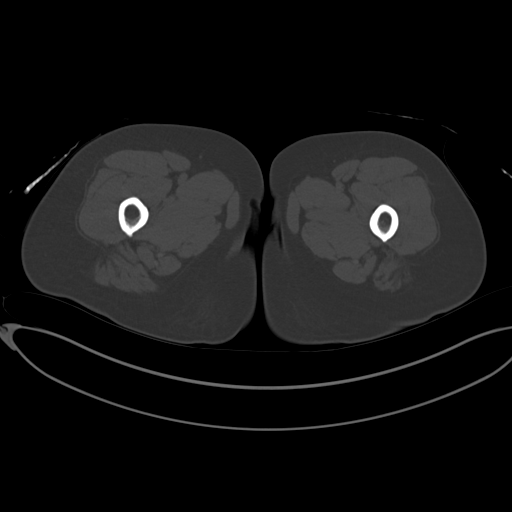
[im 14/96  soft-tissue]
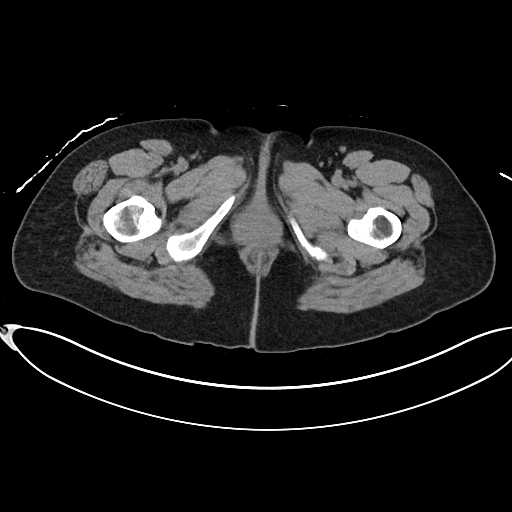
[im 19/96  soft-tissue]
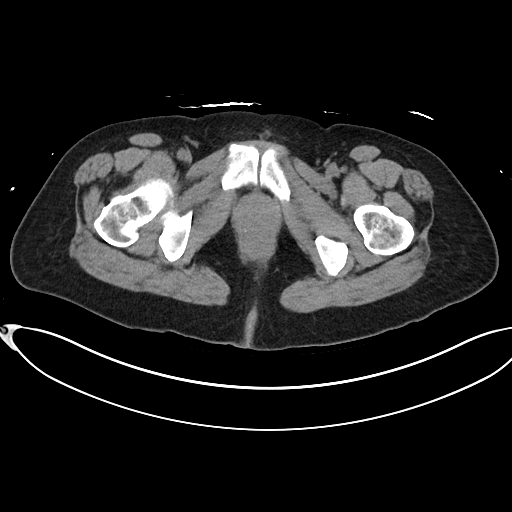
[im 28/96  soft-tissue]
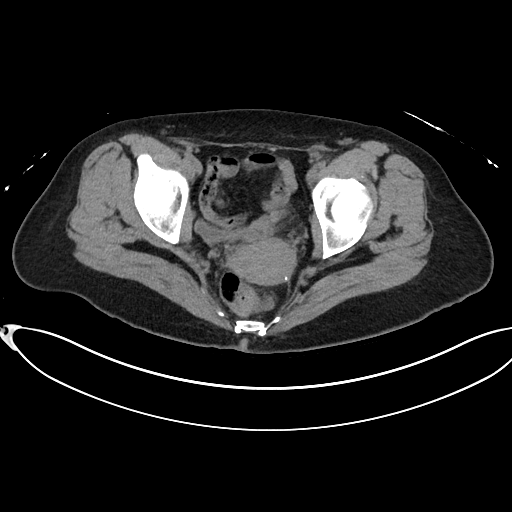
[im 32/96  soft-tissue]
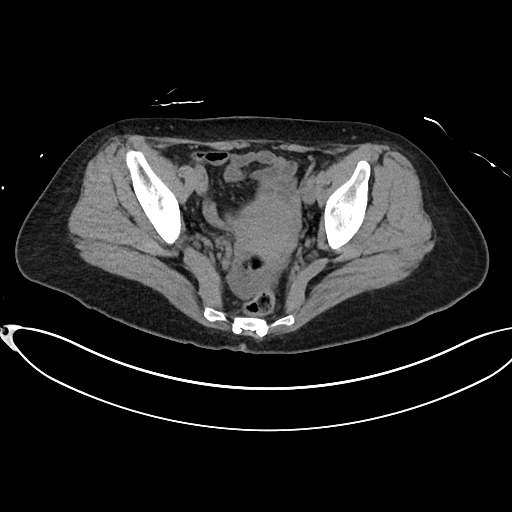
[im 41/96  soft-tissue]
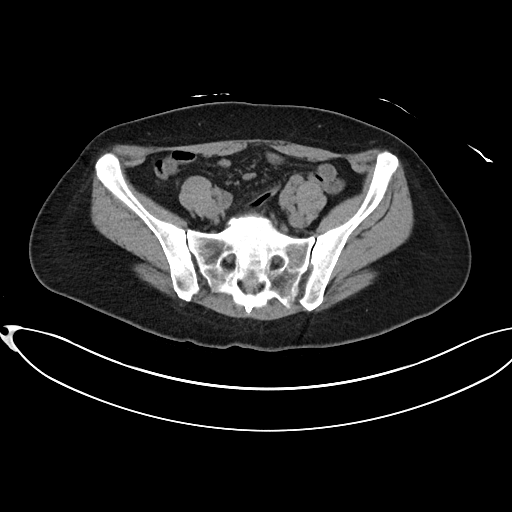
[im 50/96  soft-tissue]
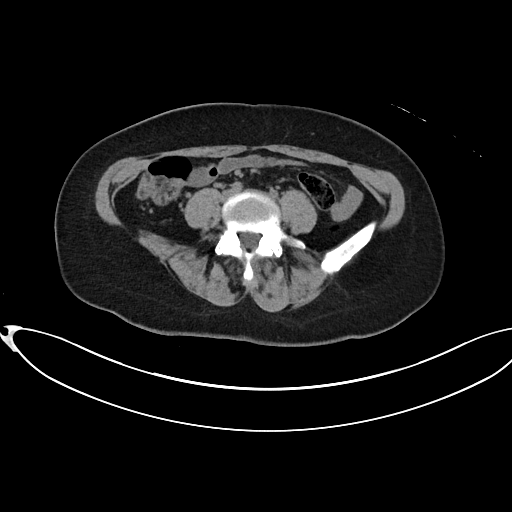
[im 55/96  soft-tissue]
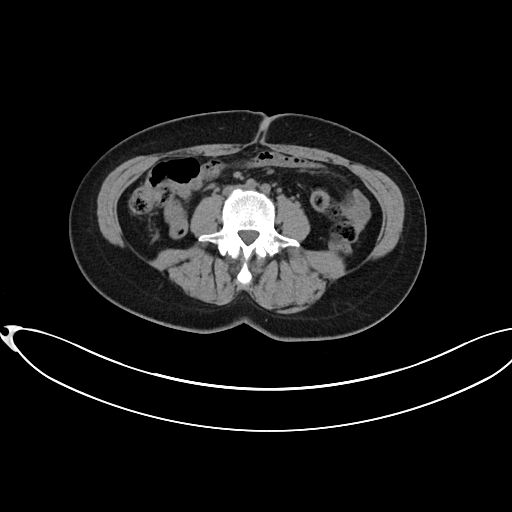
[im 64/96  soft-tissue]
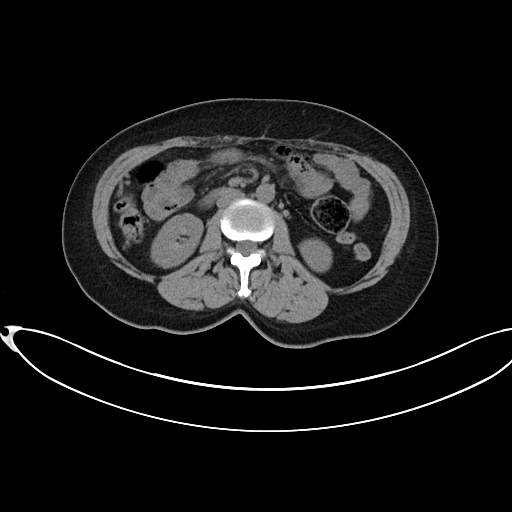
[im 64/96  bone]
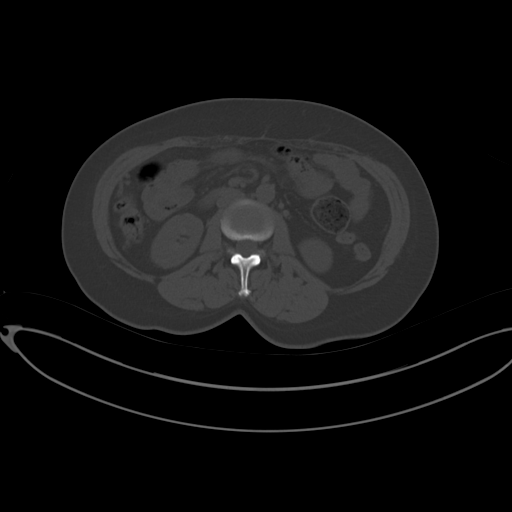
[im 68/96  soft-tissue]
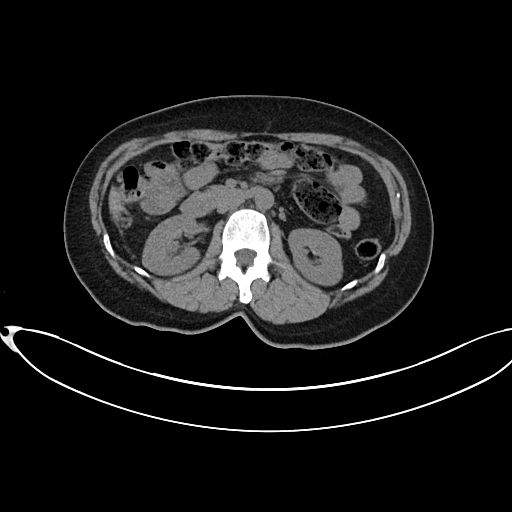
[im 77/96  soft-tissue]
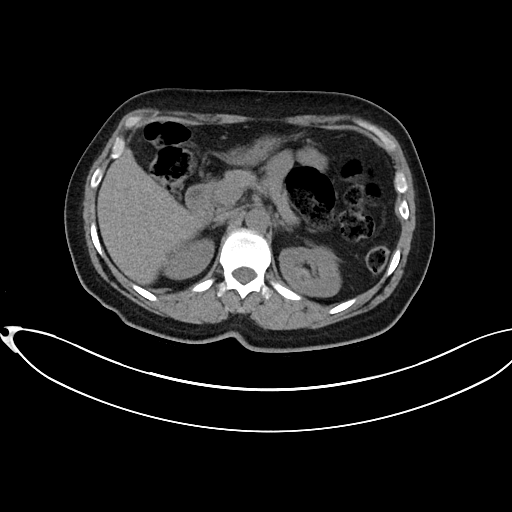
[im 82/96  soft-tissue]
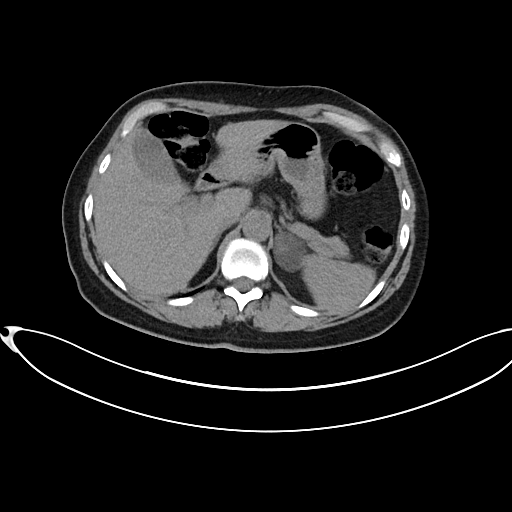
[im 91/96  soft-tissue]
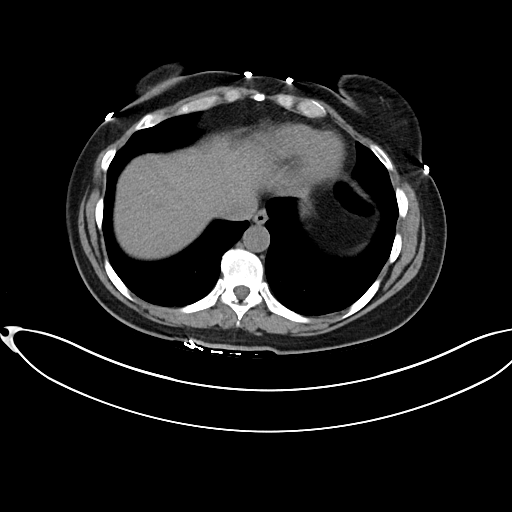

[Series 6: cor · coronal · 0.80mm/px · 3 of 86 slices shown]
[im 29/86  soft-tissue]
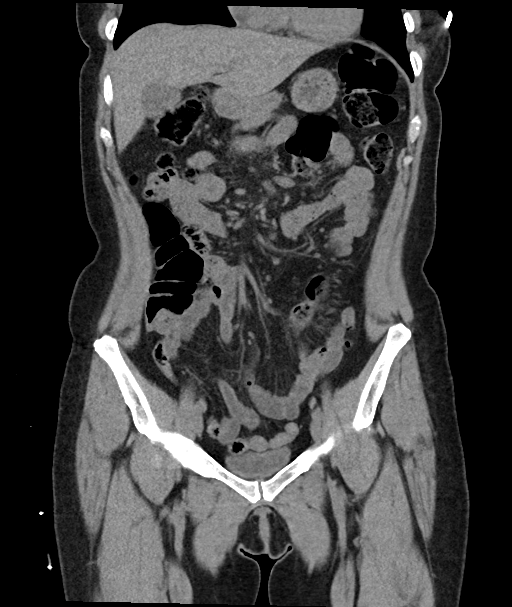
[im 38/86  soft-tissue]
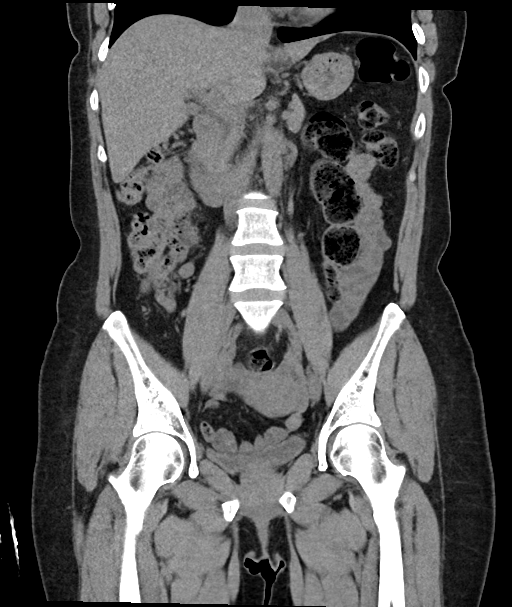
[im 48/86  soft-tissue]
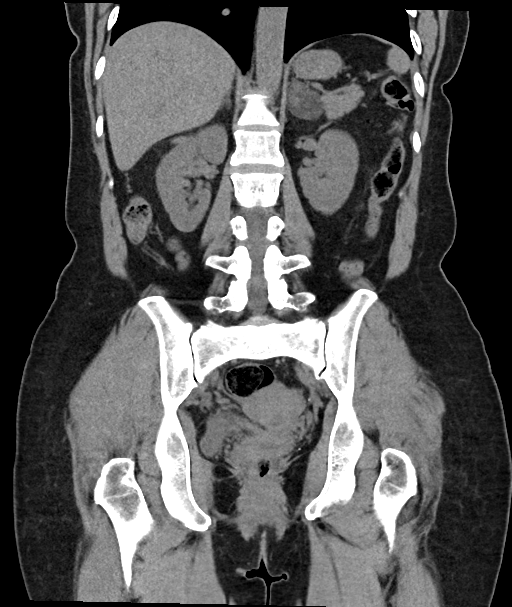

[16 of 46 positions shown; findings below may reference images not displayed]

FINDINGS: Lower chest: No acute abnormality.

Hepatobiliary: No solid liver abnormality is seen. No gallstones,
gallbladder wall thickening, or biliary dilatation.

Pancreas: Unremarkable. No pancreatic ductal dilatation or
surrounding inflammatory changes.

Spleen: Normal in size without significant abnormality.

Adrenals/Urinary Tract: Stable, definitively benign fat containing
left adrenal adenoma (series 3, image 16). Punctuate bilateral
nonobstructive renal calculi. No ureteral calculi or hydronephrosis.
Bladder is unremarkable.

Stomach/Bowel: Stomach is within normal limits. Appendix is not
clearly visualized. No evidence of bowel wall thickening,
distention, or inflammatory changes.

Vascular/Lymphatic: Aortic atherosclerosis. No enlarged abdominal or
pelvic lymph nodes.

Reproductive: No mass or other significant abnormality.

Other: No abdominal wall hernia or abnormality. Trace ascites in the
low pelvis, similar to prior examination (series 3, image 66).

Musculoskeletal: No acute or significant osseous findings.
IMPRESSION: 1. Punctuate bilateral nonobstructive renal calculi. No ureteral
calculi or hydronephrosis.
2. The appendix is not clearly visualized. There are no secondary
inflammatory findings in the right lower quadrant.
3. Trace ascites in the low pelvis, similar to prior examination, of
uncertain significance.

Aortic Atherosclerosis (KS71M-G39.9).

## 2022-02-04 ENCOUNTER — Other Ambulatory Visit: Payer: Self-pay | Admitting: Family Medicine

## 2022-03-28 LAB — HM MAMMOGRAPHY

## 2022-06-07 ENCOUNTER — Ambulatory Visit: Payer: No Typology Code available for payment source | Attending: Neurosurgery | Admitting: Physical Therapy

## 2022-06-07 ENCOUNTER — Encounter: Payer: Self-pay | Admitting: Physical Therapy

## 2022-06-07 ENCOUNTER — Other Ambulatory Visit: Payer: Self-pay

## 2022-06-07 DIAGNOSIS — M542 Cervicalgia: Secondary | ICD-10-CM | POA: Diagnosis not present

## 2022-06-07 DIAGNOSIS — R293 Abnormal posture: Secondary | ICD-10-CM | POA: Insufficient documentation

## 2022-06-07 DIAGNOSIS — M6281 Muscle weakness (generalized): Secondary | ICD-10-CM | POA: Diagnosis present

## 2022-06-07 NOTE — Therapy (Signed)
OUTPATIENT PHYSICAL THERAPY CERVICAL EVALUATION   Patient Name: Rachel Cabrera MRN: [id number] DOB:May 03, 1965, 57 y.o., female Today's Date: 06/07/2022  END OF SESSION:  PT End of Session - 06/07/22 0759     Visit Number 1    Number of Visits 16    Date for PT Re-Evaluation 08/02/22    Authorization Type [employer]    PT Start Time 0800    PT Stop Time 0845    PT Time Calculation (min) 45 min    Activity Tolerance Patient tolerated treatment well    Behavior During Therapy [facility] for tasks assessed/performed             Past Medical History:  Diagnosis Date   GERD (gastroesophageal reflux disease)    Hyperlipidemia    Hypertension    Migraines    Night sweat    PMDD (premenstrual dysphoric disorder)    UTI (lower urinary tract infection)    Past Surgical History:  Procedure Laterality Date   CESAREAN SECTION  1989, 1992   COLONOSCOPY WITH PROPOFOL N/A 11/06/2017   Procedure: COLONOSCOPY WITH PROPOFOL;  Surgeon: Midge Minium, MD;  Location: [facility] ENDOSCOPY;  Service: Endoscopy;  Laterality: N/A;   TUBAL LIGATION  2004   Patient Active Problem List   Diagnosis Date Noted   Bacterial conjunctivitis of both eyes 04/11/2021   Flank pain 11/05/2019   Acute neck pain 04/15/2018   Elevated blood pressure reading 03/27/2018   Migraines 03/27/2018   Encounter for screening colonoscopy    Polyp of sigmoid colon    Benign neoplasm of descending colon    Benign neoplasm of ascending colon    Hyperlipidemia 10/03/2017   Vitamin D deficiency 09/21/2016    PCP: Vernona Rieger, NP  REFERRING PROVIDER: Tressie Stalker, MD  REFERRING DIAG: M54.2 (ICD-10-CM) - Cervicalgia  THERAPY DIAG:  Cervicalgia  Abnormal posture  Muscle weakness (generalized)  Rationale for Evaluation and Treatment: Rehabilitation  ONSET DATE: Before COVID  SUBJECTIVE:                                                                                                                                                                                                          SUBJECTIVE STATEMENT: Pt reports issues with her neck. She states it's been going on for a long time. Pt reports she can hear her neck popping and cracking. She notes pain and stiffness. Certain times it flares up and takes weeks to calm back down. First started in her shoulder and then settled in her neck. Had done PT in the past  but then COVID hit. Sleeps with pillow in curve of her neck for support but constantly flipping/flopping.  Hand dominance: Right  PERTINENT HISTORY:  None indicated on file  PAIN:  Are you having pain? Yes: NPRS scale: currently 0, at worst 8 or 9/10 Pain location: Top of the shoulders to sides of neck Pain description: stiffness, dull, aching Aggravating factors: Sit with neck turned for long periods, traveling in car, turning neck Relieving factors: Heat  PRECAUTIONS: None  WEIGHT BEARING RESTRICTIONS: No  FALLS:  Has patient fallen in last 6 months? 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 Has been using stand up desk which has been helping  PLOF: Independent  PATIENT GOALS: Improve pain and neck mobility  NEXT MD VISIT: none on file  OBJECTIVE:   DIAGNOSTIC FINDINGS:  Nothing recent on file  PATIENT SURVEYS:  FOTO 46; predicted 60  COGNITION: Overall cognitive status: Within functional limits for tasks assessed  SENSATION: WFL  POSTURE: rounded shoulders and forward head, GH head anteriorly rotated  PALPATION: Most TTP along bilat UTs (L worse than R), cervical paraspinals, suboccipitals (L worse than R) Cervical lateral glides WFL but with pain on L, PA mobs WFL but tender   CERVICAL ROM:   Active ROM A/PROM (deg) eval  Flexion 25  Extension 45  Right lateral flexion 20*  Left lateral flexion 20*   Right rotation 35*  Left rotation 35*   (Blank rows = not tested) *  = concordant pain  UPPER EXTREMITY ROM: All motions grossly WFL  Active ROM Right eval Left eval  Shoulder flexion    Shoulder extension    Shoulder abduction    Shoulder adduction    Shoulder extension    Shoulder internal rotation    Shoulder external rotation    Elbow flexion    Elbow extension    Wrist flexion    Wrist extension    Wrist ulnar deviation    Wrist radial deviation    Wrist pronation    Wrist supination     (Blank rows = not tested)  UPPER EXTREMITY MMT:  MMT Right eval Left eval  Shoulder flexion 4 4  Shoulder extension 5 5  Shoulder abduction 4 4  Shoulder adduction    Shoulder internal rotation 4 4  Shoulder external rotation 3+ 3+  Middle trapezius 3+ 3  Lower trapezius 3+ 3  Elbow flexion    Elbow extension    Wrist flexion    Wrist extension    Wrist ulnar deviation    Wrist radial deviation    Wrist pronation    Wrist supination    Grip strength     (Blank rows = not tested)  CERVICAL SPECIAL TESTS:  Neck flexor muscle endurance test: Positive, Spurling's test: Negative, and Distraction test: Negative   FUNCTIONAL TESTS:  <5 sec of deep neck flexor activation in supine  TODAY'S TREATMENT:  DATE: 06/07/22  See HEP below  PATIENT EDUCATION:  Education details: Exam findings, POC, initial HEP, TPDN Person educated: Patient Education method: Explanation, Demonstration, Verbal cues, and Handouts Education comprehension: verbalized understanding, returned demonstration, and needs further education  HOME EXERCISE PROGRAM: Access Code: [email or web address] URL: [email or web address] Date: 06/07/2022 Prepared by: Vernon Prey April Kirstie Peri  Exercises - Seated Scapular Retraction  - 1 x daily - 7 x weekly - 2 sets - 10 reps - Shoulder  External Rotation and Scapular Retraction  - 1 x daily - 7 x weekly - 2 sets - 10 reps - Shoulder External Rotation in 45 Degrees Abduction  - 1 x daily - 7 x weekly - 2 sets - 10 reps - Standing Bilateral Low Shoulder Row with Anchored Resistance  - 1 x daily - 7 x weekly - 2 sets - 10 reps - Shoulder extension with resistance - Neutral  - 1 x daily - 7 x weekly - 2 sets - 10 reps - Doorway Pec Stretch at 90 Degrees Abduction  - 1 x daily - 7 x weekly - 2 sets - 30 sec hold - Doorway Pec Stretch at 60 Elevation  - 1 x daily - 7 x weekly - 2 sets - 30 sec hold - Standing Upper Trapezius Mobilization with Small Ball  - 1 x daily - 7 x weekly - 2 sets - 30 sec hold - Supine Suboccipital Release with Tennis Balls  - 1 x daily - 7 x weekly - 2 sets - 30 sec hold  Patient Education - Trigger Point Dry Needling  ASSESSMENT:  CLINICAL IMPRESSION: Patient is a 57 y.o. F who was seen today for physical therapy evaluation and treatment for chronic bilat neck pain (L worse than R). Assessment significant for very weak postural stabilizing muscles, decreased deep neck flexor strength/endurance and with multiple trigger points and tightness in suboccipitals, cervical paraspinals, and UTs. Discussed TPDN as a treatment option. Cervical joint play appears WFL. Pt would highly benefit from PT to address these deficits for improved comfort with work tasks and travel.   OBJECTIVE IMPAIRMENTS: decreased activity tolerance, decreased endurance, decreased mobility, decreased ROM, decreased strength, increased fascial restrictions, increased muscle spasms, impaired flexibility, impaired UE functional use, improper body mechanics, postural dysfunction, and pain.   ACTIVITY LIMITATIONS: lifting, sitting, and sleeping  PARTICIPATION LIMITATIONS: driving, community activity, and occupation  PERSONAL FACTORS: Fitness, Past/current experiences, and Time since onset of injury/illness/exacerbation are also affecting  patient's functional outcome.   REHAB POTENTIAL: Good  CLINICAL DECISION MAKING: Stable/uncomplicated  EVALUATION COMPLEXITY: Low   GOALS: Goals reviewed with patient? Yes  SHORT TERM GOALS: Target date: 07/05/2022   Pt will be ind with initial HEP Baseline:  Goal status: INITIAL  2.  Pt will have improved cervical rotation by >/=10 deg Baseline:  Goal status: INITIAL   LONG TERM GOALS: Target date: 08/02/2022   Pt will be ind with management and progression of HEP Baseline:  Goal status: INITIAL  2.  Pt will have improved FOTO score to >/=58 Baseline:  Goal status: INITIAL  3.  Pt will have increased cervical rotation and lateral flexion >/=15 deg from baseline Baseline:  Goal status: INITIAL  4.  Pt will report improved pain by >/=50% Baseline:  Goal status: INITIAL   PLAN:  PT FREQUENCY: 2x/week  PT DURATION: 8 weeks  PLANNED INTERVENTIONS: Therapeutic exercises, Therapeutic activity, Neuromuscular re-education, Balance training, Gait training, Patient/Family education, Self Care, Joint mobilization, Joint manipulation, Aquatic Therapy, Dry Needling, Spinal  mobilization, Cryotherapy, Moist heat, Taping, Traction, Ionotophoresis /ml Dexamethasone, Manual therapy, and Re-evaluation  PLAN FOR NEXT SESSION: Assess response to HEP. Work on Counsellor. Neck ROM/isometrics as tolerated. Manual work/TPDN as indicated.    Charla Criscione April Ma L Finley Chevez, PT 06/07/2022, 1:17 PM

## 2022-06-12 ENCOUNTER — Encounter: Payer: Self-pay | Admitting: Physical Therapy

## 2022-06-12 ENCOUNTER — Ambulatory Visit: Payer: No Typology Code available for payment source | Admitting: Physical Therapy

## 2022-06-12 DIAGNOSIS — M542 Cervicalgia: Secondary | ICD-10-CM | POA: Diagnosis not present

## 2022-06-12 DIAGNOSIS — R293 Abnormal posture: Secondary | ICD-10-CM

## 2022-06-12 DIAGNOSIS — M6281 Muscle weakness (generalized): Secondary | ICD-10-CM

## 2022-06-12 NOTE — Therapy (Signed)
OUTPATIENT PHYSICAL THERAPY TREATMENT NOTE   Patient Name: Rachel Cabrera MRN: [id number] DOB:09-22-1965, 57 y.o., female Today's Date: 06/12/2022  PCP: Vernona Rieger, NP   REFERRING PROVIDER: Tressie Stalker, MD  END OF SESSION:   PT End of Session - 06/12/22 0721     Visit Number 2    Number of Visits 16    Date for PT Re-Evaluation 08/02/22    Authorization Type [employer]et[employer]    PT Start Time 0720    PT Stop Time 0800    PT Time Calculation (min) 40 min             Past Medical History:  Diagnosis Date   GERD (gastroesophageal reflux disease)    Hyperlipidemia    Hypertension    Migraines    Night sweat    PMDD (premenstrual dysphoric disorder)    UTI (lower urinary tract infection)    Past Surgical History:  Procedure Laterality Date   CESAREAN SECTION  1989, 1992   COLONOSCOPY WITH PROPOFOL N/A 11/06/2017   Procedure: COLONOSCOPY WITH PROPOFOL;  Surgeon: Midge Minium, MD;  Location: [facility] ENDOSCOPY;  Service: Endoscopy;  Laterality: N/A;   TUBAL LIGATION  2004   Patient Active Problem List   Diagnosis Date Noted   Bacterial conjunctivitis of both eyes 04/11/2021   Flank pain 11/05/2019   Acute neck pain 04/15/2018   Elevated blood pressure reading 03/27/2018   Migraines 03/27/2018   Encounter for screening colonoscopy    Polyp of sigmoid colon    Benign neoplasm of descending colon    Benign neoplasm of ascending colon    Hyperlipidemia 10/03/2017   Vitamin D deficiency 09/21/2016    REFERRING DIAG: M54.2 (ICD-10-CM) - Cervicalgia  THERAPY DIAG:  Cervicalgia  Abnormal posture  Muscle weakness (generalized)  Rationale for Evaluation and Treatment Rehabilitation  PERTINENT HISTORY:  None indicated on file  PRECAUTIONS: none  SUBJECTIVE:                                                                                                                                                                                      SUBJECTIVE STATEMENT:   Working on the exercises and using the tennis ball. My pain is 3/10 right now.    PAIN:  Are you having pain? Yes: NPRS scale: currently 0, at worst 8 or 9/10 Pain location: Top of the shoulders to sides of neck Pain description: stiffness, dull, aching Aggravating factors: Sit with neck turned for long periods, traveling in car, turning neck Relieving factors: Heat   OBJECTIVE: (objective measures completed at initial evaluation unless otherwise dated)   DIAGNOSTIC FINDINGS:  Nothing recent on file  PATIENT SURVEYS:  FOTO 46; predicted 90   COGNITION: Overall cognitive status: Within functional limits for tasks assessed   SENSATION: WFL   POSTURE: rounded shoulders and forward head, GH head anteriorly rotated   PALPATION: Most TTP along bilat UTs (L worse than R), cervical paraspinals, suboccipitals (L worse than R) Cervical lateral glides WFL but with pain on L, PA mobs WFL but tender        CERVICAL ROM:    Active ROM A/PROM (deg) eval  Flexion 25  Extension 45  Right lateral flexion 20*  Left lateral flexion 20*  Right rotation 35*  Left rotation 35*   (Blank rows = not tested) *  = concordant pain   UPPER EXTREMITY ROM: All motions grossly WFL   Active ROM Right eval Left eval  Shoulder flexion      Shoulder extension      Shoulder abduction      Shoulder adduction      Shoulder extension      Shoulder internal rotation      Shoulder external rotation      Elbow flexion      Elbow extension      Wrist flexion      Wrist extension      Wrist ulnar deviation      Wrist radial deviation      Wrist pronation      Wrist supination       (Blank rows = not tested)   UPPER EXTREMITY MMT:   MMT Right eval Left eval  Shoulder flexion 4 4  Shoulder extension 5 5  Shoulder abduction 4 4  Shoulder adduction      Shoulder internal rotation 4 4  Shoulder external rotation 3+ 3+  Middle trapezius 3+ 3  Lower trapezius 3+ 3  Elbow flexion       Elbow extension      Wrist flexion      Wrist extension      Wrist ulnar deviation      Wrist radial deviation      Wrist pronation      Wrist supination      Grip strength       (Blank rows = not tested)   CERVICAL SPECIAL TESTS:  Neck flexor muscle endurance test: Positive, Spurling's test: Negative, and Distraction test: Negative     FUNCTIONAL TESTS:  <5 sec of deep neck flexor activation in supine   TODAY'S TREATMENT:                                                                                                                              [facility] Adult PT Treatment:                                                DATE: 06/12/22  Therapeutic Exercise: UBE Level 1 2 min each way Shoulder row red x 20 Shoulder ext red x 20  Shoulder ER and retract AROM x 10 then red band x 10 Shoulder ER in 45 deg abduction AROM x 10 then red band x 10 Pec stretch mid and high 2 x 30 sec each Levator stretch seated Supine chin tuck on towel x 10 Self Care:  Use of theracane for self TPR  DATE: 06/07/22  See HEP below   PATIENT EDUCATION:  Education details: Exam findings, POC, initial HEP, TPDN Person educated: Patient Education method: Explanation, Demonstration, Verbal cues, and Handouts Education comprehension: verbalized understanding, returned demonstration, and needs further education   HOME EXERCISE PROGRAM: Access Code: [email or web address] URL: [email or web address] Date: 06/07/2022 Prepared by: Vernon Prey April Kirstie Peri   Exercises - Seated Scapular Retraction  - 1 x daily - 7 x weekly - 2 sets - 10 reps - Shoulder External Rotation and Scapular Retraction  - 1 x daily - 7 x weekly - 2 sets - 10 reps - Shoulder External Rotation in 45 Degrees Abduction  - 1 x daily - 7 x weekly - 2 sets - 10 reps - Standing Bilateral Low Shoulder Row with Anchored Resistance  - 1 x daily - 7 x weekly - 2 sets - 10 reps - Shoulder extension with resistance - Neutral  - 1 x daily - 7 x  weekly - 2 sets - 10 reps - Doorway Pec Stretch at 90 Degrees Abduction  - 1 x daily - 7 x weekly - 2 sets - 30 sec hold - Doorway Pec Stretch at 60 Elevation  - 1 x daily - 7 x weekly - 2 sets - 30 sec hold - Standing Upper Trapezius Mobilization with Small Ball  - 1 x daily - 7 x weekly - 2 sets - 30 sec hold - Supine Suboccipital Release with Tennis Balls  - 1 x daily - 7 x weekly - 2 sets - 30 sec hold Added 06/12/22 - Supine Cervical Retraction with Towel  - 1 x daily - 7 x weekly - 1-2 sets - 10 reps - 5 hold - Gentle Levator Scapulae Stretch  - 1 x daily - 7 x weekly - 1 sets - 3 reps - 30 hold   Patient Education - Trigger Point Dry Needling   ASSESSMENT:   CLINICAL IMPRESSION: Patient is a 57 y.o. F who was seen today for physical therapy treatment for chronic bilat neck pain (L worse than R). She reports compliance with HEP. Able to progress and add resistance with scapular movements and progress to supine chin tuck. Introduced theracane for option to address upper trap Trigger points. She is having difficulty with the tennis ball. She is interested in [facility] and was advised that she could possibly receive it next session. Pt would highly benefit from PT to address these deficits for improved comfort with work tasks and travel.    OBJECTIVE IMPAIRMENTS: decreased activity tolerance, decreased endurance, decreased mobility, decreased ROM, decreased strength, increased fascial restrictions, increased muscle spasms, impaired flexibility, impaired UE functional use, improper body mechanics, postural dysfunction, and pain.    ACTIVITY LIMITATIONS: lifting, sitting, and sleeping   PARTICIPATION LIMITATIONS: driving, community activity, and occupation   PERSONAL FACTORS: Fitness, Past/current experiences, and Time since onset of injury/illness/exacerbation are also affecting patient's functional outcome.    REHAB POTENTIAL: Good   CLINICAL DECISION MAKING: Stable/uncomplicated    EVALUATION COMPLEXITY: Low     GOALS: Goals reviewed with  patient? Yes   SHORT TERM GOALS: Target date: 07/05/2022     Pt will be ind with initial HEP Baseline:  Goal status: INITIAL   2.  Pt will have improved cervical rotation by >/=10 deg Baseline:  Goal status: INITIAL     LONG TERM GOALS: Target date: 08/02/2022     Pt will be ind with management and progression of HEP Baseline:  Goal status: INITIAL   2.  Pt will have improved FOTO score to >/=58 Baseline:  Goal status: INITIAL   3.  Pt will have increased cervical rotation and lateral flexion >/=15 deg from baseline Baseline:  Goal status: INITIAL   4.  Pt will report improved pain by >/=50% Baseline:  Goal status: INITIAL     PLAN:   PT FREQUENCY: 2x/week   PT DURATION: 8 weeks   PLANNED INTERVENTIONS: Therapeutic exercises, Therapeutic activity, Neuromuscular re-education, Balance training, Gait training, Patient/Family education, Self Care, Joint mobilization, Joint manipulation, Aquatic Therapy, Dry Needling, Spinal mobilization, Cryotherapy, Moist heat, Taping, Traction, Ionotophoresis 4mg /ml Dexamethasone, Manual therapy, and Re-evaluation   PLAN FOR NEXT SESSION: Assess response to HEP. Work on Counsellor. Neck ROM/isometrics as tolerated. Manual work/TPDN as indicated. Anne Ng should be available for TPDN next session.   Jannette Spanner, PTA 06/12/22 3:41 PM Phone: [phone] Fax: [phone]

## 2022-06-14 ENCOUNTER — Encounter: Payer: Self-pay | Admitting: Physical Therapy

## 2022-06-14 ENCOUNTER — Ambulatory Visit: Payer: No Typology Code available for payment source | Attending: Neurosurgery | Admitting: Physical Therapy

## 2022-06-14 DIAGNOSIS — R293 Abnormal posture: Secondary | ICD-10-CM | POA: Insufficient documentation

## 2022-06-14 DIAGNOSIS — M6281 Muscle weakness (generalized): Secondary | ICD-10-CM | POA: Insufficient documentation

## 2022-06-14 DIAGNOSIS — M542 Cervicalgia: Secondary | ICD-10-CM | POA: Insufficient documentation

## 2022-06-14 NOTE — Therapy (Signed)
OUTPATIENT PHYSICAL THERAPY TREATMENT NOTE   Patient Name: Rachel Cabrera MRN: [id number] DOB:1965-04-16, 57 y.o., female Today's Date: 06/14/2022  PCP: Vernona Rieger, NP   REFERRING PROVIDER: Tressie Stalker, MD  END OF SESSION:   PT End of Session - 06/14/22 0848     Visit Number 3    Number of Visits 16    Date for PT Re-Evaluation 08/02/22    Authorization Type [employer]    PT Start Time 0845    PT Stop Time 0925    PT Time Calculation (min) 40 min    Activity Tolerance Patient tolerated treatment well    Behavior During Therapy [facility] for tasks assessed/performed              Past Medical History:  Diagnosis Date   GERD (gastroesophageal reflux disease)    Hyperlipidemia    Hypertension    Migraines    Night sweat    PMDD (premenstrual dysphoric disorder)    UTI (lower urinary tract infection)    Past Surgical History:  Procedure Laterality Date   CESAREAN SECTION  1989, 1992   COLONOSCOPY WITH PROPOFOL N/A 11/06/2017   Procedure: COLONOSCOPY WITH PROPOFOL;  Surgeon: Midge Minium, MD;  Location: [facility] ENDOSCOPY;  Service: Endoscopy;  Laterality: N/A;   TUBAL LIGATION  2004   Patient Active Problem List   Diagnosis Date Noted   Bacterial conjunctivitis of both eyes 04/11/2021   Flank pain 11/05/2019   Acute neck pain 04/15/2018   Elevated blood pressure reading 03/27/2018   Migraines 03/27/2018   Encounter for screening colonoscopy    Polyp of sigmoid colon    Benign neoplasm of descending colon    Benign neoplasm of ascending colon    Hyperlipidemia 10/03/2017   Vitamin D deficiency 09/21/2016    REFERRING DIAG: M54.2 (ICD-10-CM) - Cervicalgia  THERAPY DIAG:  Cervicalgia  Abnormal posture  Muscle weakness (generalized)  Rationale for Evaluation and Treatment Rehabilitation  PERTINENT HISTORY:  None indicated on file  PRECAUTIONS: none  SUBJECTIVE:                                                                                                                                                                                       SUBJECTIVE STATEMENT:  I did my exercises last night instead of this AM.  I feel stiff today. Pain is 4/10   PAIN:  Are you having pain? Yes: NPRS scale:4/10 Pain location: Top of the shoulders to sides of neck Pain description: stiffness, dull, aching Aggravating factors: Sit with neck turned for long periods, traveling in car, turning neck Relieving factors: Heat   OBJECTIVE: (objective measures completed at initial  evaluation unless otherwise dated)   DIAGNOSTIC FINDINGS:  Nothing recent on file   PATIENT SURVEYS:  FOTO 46; predicted 26   COGNITION: Overall cognitive status: Within functional limits for tasks assessed   SENSATION: WFL   POSTURE: rounded shoulders and forward head, GH head anteriorly rotated   PALPATION: Most TTP along bilat UTs (L worse than R), cervical paraspinals, suboccipitals (L worse than R) Cervical lateral glides WFL but with pain on L, PA mobs WFL but tender        CERVICAL ROM:    Active ROM A/PROM (deg) eval  Flexion 25  Extension 45  Right lateral flexion 20*  Left lateral flexion 20*  Right rotation 35*  Left rotation 35*   (Blank rows = not tested) *  = concordant pain   UPPER EXTREMITY ROM: All motions grossly WFL   Active ROM Right eval Left eval  Shoulder flexion      Shoulder extension      Shoulder abduction      Shoulder adduction      Shoulder extension      Shoulder internal rotation      Shoulder external rotation      Elbow flexion      Elbow extension      Wrist flexion      Wrist extension      Wrist ulnar deviation      Wrist radial deviation      Wrist pronation      Wrist supination       (Blank rows = not tested)   UPPER EXTREMITY MMT:   MMT Right eval Left eval  Shoulder flexion 4 4  Shoulder extension 5 5  Shoulder abduction 4 4  Shoulder adduction      Shoulder internal rotation 4 4  Shoulder external  rotation 3+ 3+  Middle trapezius 3+ 3  Lower trapezius 3+ 3  Elbow flexion      Elbow extension      Wrist flexion      Wrist extension      Wrist ulnar deviation      Wrist radial deviation      Wrist pronation      Wrist supination      Grip strength       (Blank rows = not tested)   CERVICAL SPECIAL TESTS:  Neck flexor muscle endurance test: Positive, Spurling's test: Negative, and Distraction test: Negative     FUNCTIONAL TESTS:  <5 sec of deep neck flexor activation in supine   TODAY'S TREATMENT:                 [facility] Adult PT Treatment:                                                DATE: 06/14/22 Therapeutic Exercise: UBE 5 min level 1  Pec stretch mid and high 2 x 30 sec each Levator stretch seated x 2 each side  Upper trap seated x 2  each  Wall for posture, red band Horizontal abd x 15  Manual Therapy: Trigger Point Dry Needling Treatment: Pre-treatment instruction: Patient instructed on dry needling rationale, procedures, and possible side effects including pain during treatment (achy,cramping feeling), bruising, drop of blood, lightheadedness, nausea, sweating. Patient Consent Given: Yes Education handout provided: Yes Muscles treated: upper trap x 2   Treatment  response/outcome: Twitch response elicited and Palpable decrease in muscle tension Post-treatment instructions: Patient instructed to expect possible mild to moderate muscle soreness later today and/or tomorrow. Patient instructed in methods to reduce muscle soreness and to continue prescribed HEP. If patient was dry needled over the lung field, patient was instructed on signs and symptoms of pneumothorax and, however unlikely, to see immediate medical attention should they occur. Patient was also educated on signs and symptoms of infection and to seek medical attention should they occur. Patient verbalized understanding of these instructions and education.   Soft tissue work to bilateral UT in prone                                                                                                            [facility] Adult PT Treatment:                                                DATE: 06/12/22 Therapeutic Exercise: UBE Level 1, 2 min each way Shoulder row red x 20 Shoulder ext red x 20  Shoulder ER and retract AROM x 10 then red band x 10 Shoulder ER in 45 deg abduction AROM x 10 then red band x 10 Pec stretch mid and high 2 x 30 sec each Levator stretch seated Supine chin tuck on towel x 10 Self Care:  Use of theracane for self TPR  DATE: 06/07/22  See HEP below   PATIENT EDUCATION:  Education details: TPDN, posture, stretch  Person educated: Patient Education method: Explanation, Demonstration, Verbal cues, and Handouts Education comprehension: verbalized understanding, returned demonstration, and needs further education   HOME EXERCISE PROGRAM: Access Code: [email or web address] URL: [email or web address] Date: 06/07/2022 Prepared by: Vernon Prey April Kirstie Peri   Exercises - Seated Scapular Retraction  - 1 x daily - 7 x weekly - 2 sets - 10 reps - Shoulder External Rotation and Scapular Retraction  - 1 x daily - 7 x weekly - 2 sets - 10 reps - Shoulder External Rotation in 45 Degrees Abduction  - 1 x daily - 7 x weekly - 2 sets - 10 reps - Standing Bilateral Low Shoulder Row with Anchored Resistance  - 1 x daily - 7 x weekly - 2 sets - 10 reps - Shoulder extension with resistance - Neutral  - 1 x daily - 7 x weekly - 2 sets - 10 reps - Doorway Pec Stretch at 90 Degrees Abduction  - 1 x daily - 7 x weekly - 2 sets - 30 sec hold - Doorway Pec Stretch at 60 Elevation  - 1 x daily - 7 x weekly - 2 sets - 30 sec hold - Standing Upper Trapezius Mobilization with Small Ball  - 1 x daily - 7 x weekly - 2 sets - 30 sec hold - Supine Suboccipital Release with Tennis Balls  - 1 x daily - 7 x weekly - 2 sets -  30 sec hold Added 06/12/22 - Supine Cervical Retraction with Towel  - 1 x daily - 7 x  weekly - 1-2 sets - 10 reps - 5 hold - Gentle Levator Scapulae Stretch  - 1 x daily - 7 x weekly - 1 sets - 3 reps - 30 hold   Patient Education - Trigger Point Dry Needling   ASSESSMENT:   CLINICAL IMPRESSION: Patient has been compliant with HEP and noticing improved postural awareness with daily activities.  She had a trial of trigger point DN Anne Ng DPT) followed by soft tissue work.  She will continue  benefit from skilled PT to improve pain and neck tension, stiffness.    OBJECTIVE IMPAIRMENTS: decreased activity tolerance, decreased endurance, decreased mobility, decreased ROM, decreased strength, increased fascial restrictions, increased muscle spasms, impaired flexibility, impaired UE functional use, improper body mechanics, postural dysfunction, and pain.    ACTIVITY LIMITATIONS: lifting, sitting, and sleeping   PARTICIPATION LIMITATIONS: driving, community activity, and occupation   PERSONAL FACTORS: Fitness, Past/current experiences, and Time since onset of injury/illness/exacerbation are also affecting patient's functional outcome.    REHAB POTENTIAL: Good   CLINICAL DECISION MAKING: Stable/uncomplicated   EVALUATION COMPLEXITY: Low     GOALS: Goals reviewed with patient? Yes   SHORT TERM GOALS: Target date: 07/05/2022     Pt will be ind with initial HEP Baseline:  Goal status: ongoing    2.  Pt will have improved cervical rotation by >/=10 deg Baseline:  Goal status: INITIAL     LONG TERM GOALS: Target date: 08/02/2022     Pt will be ind with management and progression of HEP Baseline:  Goal status: INITIAL   2.  Pt will have improved FOTO score to >/=58 Baseline:  Goal status: INITIAL   3.  Pt will have increased cervical rotation and lateral flexion >/=15 deg from baseline Baseline:  Goal status: INITIAL   4.  Pt will report improved pain by >/=50% Baseline:  Goal status: INITIAL     PLAN:   PT FREQUENCY: 2x/week   PT DURATION: 8  weeks   PLANNED INTERVENTIONS: Therapeutic exercises, Therapeutic activity, Neuromuscular re-education, Balance training, Gait training, Patient/Family education, Self Care, Joint mobilization, Joint manipulation, Aquatic Therapy, Dry Needling, Spinal mobilization, Cryotherapy, Moist heat, Taping, Traction, Ionotophoresis 4mg /ml Dexamethasone, Manual therapy, and Re-evaluation   PLAN FOR NEXT SESSION: Assess response to HEP. Work on Counsellor. Neck ROM/isometrics as tolerated. Manual work/TPDN as indicated. Anne Ng should be available for TPDN next session.   Jannette Spanner, PTA 06/14/22 9:27 AM Phone: [phone] Fax: [phone]

## 2022-06-19 ENCOUNTER — Encounter: Payer: Self-pay | Admitting: Physical Therapy

## 2022-06-19 ENCOUNTER — Ambulatory Visit: Payer: No Typology Code available for payment source | Admitting: Physical Therapy

## 2022-06-19 DIAGNOSIS — R293 Abnormal posture: Secondary | ICD-10-CM

## 2022-06-19 DIAGNOSIS — M542 Cervicalgia: Secondary | ICD-10-CM

## 2022-06-19 DIAGNOSIS — M6281 Muscle weakness (generalized): Secondary | ICD-10-CM | POA: Diagnosis present

## 2022-06-19 NOTE — Therapy (Signed)
OUTPATIENT PHYSICAL THERAPY TREATMENT NOTE   Patient Name: Rachel Cabrera MRN: [id number] DOB:31-Aug-1965, 57 y.o., female Today's Date: 06/19/2022  PCP: Vernona Rieger, NP   REFERRING PROVIDER: Tressie Stalker, MD  END OF SESSION:   PT End of Session - 06/19/22 0759     Visit Number 4    Number of Visits 16    Date for PT Re-Evaluation 08/02/22    Authorization Type [employer]    PT Start Time [phone]    PT Stop Time 0840    PT Time Calculation (min) 43 min    Activity Tolerance Patient tolerated treatment well    Behavior During Therapy [facility] for tasks assessed/performed               Past Medical History:  Diagnosis Date   GERD (gastroesophageal reflux disease)    Hyperlipidemia    Hypertension    Migraines    Night sweat    PMDD (premenstrual dysphoric disorder)    UTI (lower urinary tract infection)    Past Surgical History:  Procedure Laterality Date   CESAREAN SECTION  1989, 1992   COLONOSCOPY WITH PROPOFOL N/A 11/06/2017   Procedure: COLONOSCOPY WITH PROPOFOL;  Surgeon: Midge Minium, MD;  Location: [facility] ENDOSCOPY;  Service: Endoscopy;  Laterality: N/A;   TUBAL LIGATION  2004   Patient Active Problem List   Diagnosis Date Noted   Bacterial conjunctivitis of both eyes 04/11/2021   Flank pain 11/05/2019   Acute neck pain 04/15/2018   Elevated blood pressure reading 03/27/2018   Migraines 03/27/2018   Encounter for screening colonoscopy    Polyp of sigmoid colon    Benign neoplasm of descending colon    Benign neoplasm of ascending colon    Hyperlipidemia 10/03/2017   Vitamin D deficiency 09/21/2016    REFERRING DIAG: M54.2 (ICD-10-CM) - Cervicalgia  THERAPY DIAG:  Cervicalgia  Abnormal posture  Muscle weakness (generalized)  Rationale for Evaluation and Treatment Rehabilitation  PERTINENT HISTORY:  None indicated on file  PRECAUTIONS: none  SUBJECTIVE:                                                                                                                                                                                       SUBJECTIVE STATEMENT:  Did my stretches this AM.  The needling helped a lot I would like to do it again tomorrow?   PAIN:  Are you having pain? Yes: NPRS scale:0/10 Pain location: Top of the shoulders to sides of neck Pain description: stiffness, dull, aching Aggravating factors: Sit with neck turned for long periods, traveling in car, turning neck Relieving factors: Heat   OBJECTIVE: (objective measures completed  at initial evaluation unless otherwise dated)   DIAGNOSTIC FINDINGS:  Nothing recent on file   PATIENT SURVEYS:  FOTO 46; predicted 36   COGNITION: Overall cognitive status: Within functional limits for tasks assessed   SENSATION: WFL   POSTURE: rounded shoulders and forward head, GH head anteriorly rotated   PALPATION: Most TTP along bilat UTs (L worse than R), cervical paraspinals, suboccipitals (L worse than R) Cervical lateral glides WFL but with pain on L, PA mobs WFL but tender        CERVICAL ROM:    Active ROM A/PROM (deg) eval AROM 06/19/22  Flexion 25 23, 55  Extension 45 56  Right lateral flexion 20* 37  Left lateral flexion 20* 38  Right rotation 35* 60  Left rotation 35* 55 *   (Blank rows = not tested) *  = concordant pain   UPPER EXTREMITY ROM: All motions grossly WFL   Active ROM Right eval Left eval  Shoulder flexion      Shoulder extension      Shoulder abduction      Shoulder adduction      Shoulder extension      Shoulder internal rotation      Shoulder external rotation      Elbow flexion      Elbow extension      Wrist flexion      Wrist extension      Wrist ulnar deviation      Wrist radial deviation      Wrist pronation      Wrist supination       (Blank rows = not tested)   UPPER EXTREMITY MMT:   MMT Right eval Left eval  Shoulder flexion 4 4  Shoulder extension 5 5  Shoulder abduction 4 4  Shoulder adduction       Shoulder internal rotation 4 4  Shoulder external rotation 3+ 3+  Middle trapezius 3+ 3  Lower trapezius 3+ 3  Elbow flexion      Elbow extension      Wrist flexion      Wrist extension      Wrist ulnar deviation      Wrist radial deviation      Wrist pronation      Wrist supination      Grip strength       (Blank rows = not tested)   CERVICAL SPECIAL TESTS:  Neck flexor muscle endurance test: Positive, Spurling's test: Negative, and Distraction test: Negative     FUNCTIONAL TESTS:  <5 sec of deep neck flexor activation in supine   TODAY'S TREATMENT:                  [facility] Adult PT Treatment:                                                DATE: 06/19/22 Therapeutic Exercise: UBE level 1 for 5 min  Standing bands row/scap retract and extension BTB x 20  Sidefacing diagonal  and ER GTB x 15 each side  Forward flexion GTB x 10 then alternating x 10 each  Horizontal abduction green x 15 with chin tuck supine  Book openings x 5  Sidelying shoulder scaption green band x 15  Pec stretch mid and high 2 x 30 sec each AROM check see above   [facility]  Adult PT Treatment:                                                DATE: 06/14/22 Therapeutic Exercise: UBE 5 min level 1  Pec stretch mid and high 2 x 30 sec each Levator stretch seated x 2 each side  Upper trap seated x 2  each  Wall for posture, red band Horizontal abd x 15  Manual Therapy: Trigger Point Dry Needling Treatment: Pre-treatment instruction: Patient instructed on dry needling rationale, procedures, and possible side effects including pain during treatment (achy,cramping feeling), bruising, drop of blood, lightheadedness, nausea, sweating. Patient Consent Given: Yes Education handout provided: Yes Muscles treated: upper trap x 2   Treatment response/outcome: Twitch response elicited and Palpable decrease in muscle tension Post-treatment instructions: Patient instructed to expect possible mild to moderate muscle soreness  later today and/or tomorrow. Patient instructed in methods to reduce muscle soreness and to continue prescribed HEP. If patient was dry needled over the lung field, patient was instructed on signs and symptoms of pneumothorax and, however unlikely, to see immediate medical attention should they occur. Patient was also educated on signs and symptoms of infection and to seek medical attention should they occur. Patient verbalized understanding of these instructions and education.   Soft tissue work to bilateral UT in prone                                                                                                           [facility] Adult PT Treatment:                                                DATE: 06/12/22 Therapeutic Exercise: UBE Level 1, 2 min each way Shoulder row red x 20 Shoulder ext red x 20  Shoulder ER and retract AROM x 10 then red band x 10 Shoulder ER in 45 deg abduction AROM x 10 then red band x 10 Pec stretch mid and high 2 x 30 sec each Levator stretch seated Supine chin tuck on towel x 10 Self Care:  Use of theracane for self TPR  DATE: 06/07/22  See HEP below   PATIENT EDUCATION:  Education details: TPDN, posture, stretch  Person educated: Patient Education method: Explanation, Demonstration, Verbal cues, and Handouts Education comprehension: verbalized understanding, returned demonstration, and needs further education   HOME EXERCISE PROGRAM: Access Code: [email or web address] URL: [email or web address] Date: 06/07/2022 Prepared by: Vernon Prey April Kirstie Peri   Exercises - Seated Scapular Retraction  - 1 x daily - 7 x weekly - 2 sets - 10 reps - Shoulder External Rotation and Scapular Retraction  - 1 x daily - 7 x weekly - 2 sets - 10 reps - Shoulder External Rotation in 45 Degrees Abduction  - 1 x daily -  7 x weekly - 2 sets - 10 reps - Standing Bilateral Low Shoulder Row with Anchored Resistance  - 1 x daily - 7 x weekly - 2 sets - 10 reps - Shoulder  extension with resistance - Neutral  - 1 x daily - 7 x weekly - 2 sets - 10 reps - Doorway Pec Stretch at 90 Degrees Abduction  - 1 x daily - 7 x weekly - 2 sets - 30 sec hold - Doorway Pec Stretch at 60 Elevation  - 1 x daily - 7 x weekly - 2 sets - 30 sec hold - Standing Upper Trapezius Mobilization with Small Ball  - 1 x daily - 7 x weekly - 2 sets - 30 sec hold - Supine Suboccipital Release with Tennis Balls  - 1 x daily - 7 x weekly - 2 sets - 30 sec hold Added 06/12/22 - Supine Cervical Retraction with Towel  - 1 x daily - 7 x weekly - 1-2 sets - 10 reps - 5 hold - Gentle Levator Scapulae Stretch  - 1 x daily - 7 x weekly - 1 sets - 3 reps - 30 hold   Patient Education - Trigger Point Dry Needling   ASSESSMENT:   CLINICAL IMPRESSION: Patient with a favorable outcome from dry needling last week she has gained cervical range of motion in all planes.  Focus today on upper body strengthening.  She would like to have dry needling done tomorrow if possible as she is going out of town.  Cont POC.    OBJECTIVE IMPAIRMENTS: decreased activity tolerance, decreased endurance, decreased mobility, decreased ROM, decreased strength, increased fascial restrictions, increased muscle spasms, impaired flexibility, impaired UE functional use, improper body mechanics, postural dysfunction, and pain.    ACTIVITY LIMITATIONS: lifting, sitting, and sleeping   PARTICIPATION LIMITATIONS: driving, community activity, and occupation   PERSONAL FACTORS: Fitness, Past/current experiences, and Time since onset of injury/illness/exacerbation are also affecting patient's functional outcome.    REHAB POTENTIAL: Good   CLINICAL DECISION MAKING: Stable/uncomplicated   EVALUATION COMPLEXITY: Low     GOALS: Goals reviewed with patient? Yes   SHORT TERM GOALS: Target date: 07/05/2022     Pt will be ind with initial HEP Baseline:  Goal status: ongoing    2.  Pt will have improved cervical rotation by >/=10  deg Baseline:  Goal status: INITIAL     LONG TERM GOALS: Target date: 08/02/2022     Pt will be ind with management and progression of HEP Baseline:  Goal status: INITIAL   2.  Pt will have improved FOTO score to >/=58 Baseline:  Goal status: INITIAL   3.  Pt will have increased cervical rotation and lateral flexion >/=15 deg from baseline Baseline:  Goal status: INITIAL   4.  Pt will report improved pain by >/=50% Baseline:  Goal status: INITIAL     PLAN:   PT FREQUENCY: 2x/week   PT DURATION: 8 weeks   PLANNED INTERVENTIONS: Therapeutic exercises, Therapeutic activity, Neuromuscular re-education, Balance training, Gait training, Patient/Family education, Self Care, Joint mobilization, Joint manipulation, Aquatic Therapy, Dry Needling, Spinal mobilization, Cryotherapy, Moist heat, Taping, Traction, Ionotophoresis 4mg /ml Dexamethasone, Manual therapy, and Re-evaluation   PLAN FOR NEXT SESSION: TP DN Freida Busman or Whitman Hero, PT 06/19/22 8:43 AM Phone: [phone] Fax: [phone]

## 2022-06-19 NOTE — Therapy (Unsigned)
OUTPATIENT PHYSICAL THERAPY TREATMENT NOTE   Patient Name: Rachel Cabrera MRN: [id number] DOB:Jun 23, 1965, 57 y.o., female Today's Date: 06/20/2022  PCP: Vernona Rieger, NP   REFERRING PROVIDER: Tressie Stalker, MD  END OF SESSION:   PT End of Session - 06/20/22 0720     Visit Number 5    Number of Visits 16    Date for PT Re-Evaluation 08/02/22    Authorization Type [employer]    PT Start Time [phone]    PT Stop Time 0800    PT Time Calculation (min) 44 min    Activity Tolerance Patient tolerated treatment well    Behavior During Therapy [facility] for tasks assessed/performed                Past Medical History:  Diagnosis Date   GERD (gastroesophageal reflux disease)    Hyperlipidemia    Hypertension    Migraines    Night sweat    PMDD (premenstrual dysphoric disorder)    UTI (lower urinary tract infection)    Past Surgical History:  Procedure Laterality Date   CESAREAN SECTION  1989, 1992   COLONOSCOPY WITH PROPOFOL N/A 11/06/2017   Procedure: COLONOSCOPY WITH PROPOFOL;  Surgeon: Midge Minium, MD;  Location: [facility] ENDOSCOPY;  Service: Endoscopy;  Laterality: N/A;   TUBAL LIGATION  2004   Patient Active Problem List   Diagnosis Date Noted   Bacterial conjunctivitis of both eyes 04/11/2021   Flank pain 11/05/2019   Acute neck pain 04/15/2018   Elevated blood pressure reading 03/27/2018   Migraines 03/27/2018   Encounter for screening colonoscopy    Polyp of sigmoid colon    Benign neoplasm of descending colon    Benign neoplasm of ascending colon    Hyperlipidemia 10/03/2017   Vitamin D deficiency 09/21/2016    REFERRING DIAG: M54.2 (ICD-10-CM) - Cervicalgia  THERAPY DIAG:  Cervicalgia  Abnormal posture  Muscle weakness (generalized)  Rationale for Evaluation and Treatment Rehabilitation  PERTINENT HISTORY:  None indicated on file  PRECAUTIONS: none  SUBJECTIVE:                                                                                                                                                                                       SUBJECTIVE STATEMENT:  Pt sore in general in upper body and arms.  She wants to try needling today if possible.   PAIN:  Are you having pain? Yes: NPRS scale:0/10 Pain location: Top of the shoulders to sides of neck Pain description: stiffness, dull, aching Aggravating factors: Sit with neck turned for long periods, traveling in car, turning neck Relieving factors: Heat   OBJECTIVE: (objective measures completed  at initial evaluation unless otherwise dated)   DIAGNOSTIC FINDINGS:  Nothing recent on file   PATIENT SURVEYS:  FOTO 46; predicted 75   COGNITION: Overall cognitive status: Within functional limits for tasks assessed   SENSATION: WFL   POSTURE: rounded shoulders and forward head, GH head anteriorly rotated   PALPATION: Most TTP along bilat UTs (L worse than R), cervical paraspinals, suboccipitals (L worse than R) Cervical lateral glides WFL but with pain on L, PA mobs WFL but tender        CERVICAL ROM:    Active ROM A/PROM (deg) eval AROM 06/19/22  Flexion 25 23, 55  Extension 45 56  Right lateral flexion 20* 37  Left lateral flexion 20* 38  Right rotation 35* 60  Left rotation 35* 55 *   (Blank rows = not tested) *  = concordant pain   UPPER EXTREMITY ROM: All motions grossly WFL   Active ROM Right eval Left eval  Shoulder flexion      Shoulder extension      Shoulder abduction      Shoulder adduction      Shoulder extension      Shoulder internal rotation      Shoulder external rotation      Elbow flexion      Elbow extension      Wrist flexion      Wrist extension      Wrist ulnar deviation      Wrist radial deviation      Wrist pronation      Wrist supination       (Blank rows = not tested)   UPPER EXTREMITY MMT:   MMT Right eval Left eval  Shoulder flexion 4 4  Shoulder extension 5 5  Shoulder abduction 4 4  Shoulder adduction       Shoulder internal rotation 4 4  Shoulder external rotation 3+ 3+  Middle trapezius 3+ 3  Lower trapezius 3+ 3  Elbow flexion      Elbow extension      Wrist flexion      Wrist extension      Wrist ulnar deviation      Wrist radial deviation      Wrist pronation      Wrist supination      Grip strength       (Blank rows = not tested)   CERVICAL SPECIAL TESTS:  Neck flexor muscle endurance test: Positive, Spurling's test: Negative, and Distraction test: Negative     FUNCTIONAL TESTS:  <5 sec of deep neck flexor activation in supine   TODAY'S TREATMENT:                 [facility] Adult PT Treatment:                                                DATE: 06/20/22 Therapeutic Exercise: Nustep L5 UE and LE  5 min  Standing at wall: protraction/retraction x 10 , wall push up x 10 , semi circle band pulls 30 sec red loop Cervical sidebending /UT with added hand on top of her head  Levator scap x 30 sec x 3  Cervical SNAG rotation x 3  Sidelying open book x 30 sec hold x 2 each side  Manual Therapy: Trigger Point Dry Needling Treatment: Anne Ng, DPT  Pre-treatment  instruction: Patient instructed on dry needling rationale, procedures, and possible side effects including pain during treatment (achy,cramping feeling), bruising, drop of blood, lightheadedness, nausea, sweating. Patient Consent Given: Yes Education handout provided: Yes Muscles treated: upper trap x 2  x bilateral  Treatment response/outcome: Twitch response elicited and Palpable decrease in muscle tension Post-treatment instructions: Patient instructed to expect possible mild to moderate muscle soreness later today and/or tomorrow. Patient instructed in methods to reduce muscle soreness and to continue prescribed HEP. If patient was dry needled over the lung field, patient was instructed on signs and symptoms of pneumothorax and, however unlikely, to see immediate medical attention should they occur. Patient was also educated  on signs and symptoms of infection and to seek medical attention should they occur. Patient verbalized understanding of these instructions and education.   Supine soft tissue work to posterior cervicals , upper trap, levator scapula, suboccipitals   Modalities: MHP 10 min -declined Self Care: Travel pillow    [facility] Adult PT Treatment:                                                DATE: 06/19/22 Therapeutic Exercise: UBE level 1 for 5 min  Standing bands row/scap retract and extension BTB x 20  Sidefacing diagonal  and ER GTB x 15 each side  Forward flexion GTB x 10 then alternating x 10 each  Horizontal abduction green x 15 with chin tuck supine  Book openings x 5  Sidelying shoulder scaption green band x 15  Pec stretch mid and high 2 x 30 sec each AROM check see above   [facility] Adult PT Treatment:                                                DATE: 06/14/22 Therapeutic Exercise: UBE 5 min level 1  Pec stretch mid and high 2 x 30 sec each Levator stretch seated x 2 each side  Upper trap seated x 2  each  Wall for posture, red band Horizontal abd x 15  Manual Therapy: Trigger Point Dry Needling Treatment: Pre-treatment instruction: Patient instructed on dry needling rationale, procedures, and possible side effects including pain during treatment (achy,cramping feeling), bruising, drop of blood, lightheadedness, nausea, sweating. Patient Consent Given: Yes Education handout provided: Yes Muscles treated: upper trap x 2   Treatment response/outcome: Twitch response elicited and Palpable decrease in muscle tension Post-treatment instructions: Patient instructed to expect possible mild to moderate muscle soreness later today and/or tomorrow. Patient instructed in methods to reduce muscle soreness and to continue prescribed HEP. If patient was dry needled over the lung field, patient was instructed on signs and symptoms of pneumothorax and, however unlikely, to see immediate medical attention  should they occur. Patient was also educated on signs and symptoms of infection and to seek medical attention should they occur. Patient verbalized understanding of these instructions and education.   Soft tissue work to bilateral UT in prone  [facility] Adult PT Treatment:                                                DATE: 06/12/22 Therapeutic Exercise: UBE Level 1, 2 min each way Shoulder row red x 20 Shoulder ext red x 20  Shoulder ER and retract AROM x 10 then red band x 10 Shoulder ER in 45 deg abduction AROM x 10 then red band x 10 Pec stretch mid and high 2 x 30 sec each Levator stretch seated Supine chin tuck on towel x 10 Self Care:  Use of theracane for self TPR  DATE: 06/07/22  See HEP below   PATIENT EDUCATION:  Education details: TPDN, posture, stretch  Person educated: Patient Education method: Explanation, Demonstration, Verbal cues, and Handouts Education comprehension: verbalized understanding, returned demonstration, and needs further education   HOME EXERCISE PROGRAM: Access Code: [email or web address] URL: [email or web address] Date: 06/07/2022 Prepared by: Vernon Prey April Kirstie Peri   Exercises - Seated Scapular Retraction  - 1 x daily - 7 x weekly - 2 sets - 10 reps - Shoulder External Rotation and Scapular Retraction  - 1 x daily - 7 x weekly - 2 sets - 10 reps - Shoulder External Rotation in 45 Degrees Abduction  - 1 x daily - 7 x weekly - 2 sets - 10 reps - Standing Bilateral Low Shoulder Row with Anchored Resistance  - 1 x daily - 7 x weekly - 2 sets - 10 reps - Shoulder extension with resistance - Neutral  - 1 x daily - 7 x weekly - 2 sets - 10 reps - Doorway Pec Stretch at 90 Degrees Abduction  - 1 x daily - 7 x weekly - 2 sets - 30 sec hold - Doorway Pec Stretch at 60 Elevation  - 1 x daily - 7 x weekly - 2 sets - 30 sec hold - Standing Upper  Trapezius Mobilization with Small Ball  - 1 x daily - 7 x weekly - 2 sets - 30 sec hold - Supine Suboccipital Release with Tennis Balls  - 1 x daily - 7 x weekly - 2 sets - 30 sec hold Added 06/12/22 - Supine Cervical Retraction with Towel  - 1 x daily - 7 x weekly - 1-2 sets - 10 reps - 5 hold - Gentle Levator Scapulae Stretch  - 1 x daily - 7 x weekly - 1 sets - 3 reps - 30 hold   Patient Education - Trigger Point Dry Needling   ASSESSMENT:   CLINICAL IMPRESSION: Patient overall sore but with decreased pain in her neck overall.  She will be travelling this week , recommended a memory foam pillow to relax head.  Repeated dry needling  and manual therapy to aid in cervical mobility and reduced neck tension.  Cont POC.    OBJECTIVE IMPAIRMENTS: decreased activity tolerance, decreased endurance, decreased mobility, decreased ROM, decreased strength, increased fascial restrictions, increased muscle spasms, impaired flexibility, impaired UE functional use, improper body mechanics, postural dysfunction, and pain.    ACTIVITY LIMITATIONS: lifting, sitting, and sleeping   PARTICIPATION LIMITATIONS: driving, community activity, and occupation   PERSONAL FACTORS: Fitness, Past/current experiences, and Time since onset of injury/illness/exacerbation are also affecting patient's functional outcome.    REHAB POTENTIAL: Good   CLINICAL DECISION MAKING: Stable/uncomplicated   EVALUATION COMPLEXITY: Low     GOALS: Goals  reviewed with patient? Yes   SHORT TERM GOALS: Target date: 07/05/2022     Pt will be ind with initial HEP Baseline:  Goal status: ongoing    2.  Pt will have improved cervical rotation by >/=10 deg Baseline:  Goal status: INITIAL     LONG TERM GOALS: Target date: 08/02/2022     Pt will be ind with management and progression of HEP Baseline:  Goal status: INITIAL   2.  Pt will have improved FOTO score to >/=58 Baseline:  Goal status: INITIAL   3.  Pt will have  increased cervical rotation and lateral flexion >/=15 deg from baseline Baseline:  Goal status: INITIAL   4.  Pt will report improved pain by >/=50% Baseline:  Goal status: INITIAL     PLAN:   PT FREQUENCY: 2x/week   PT DURATION: 8 weeks   PLANNED INTERVENTIONS: Therapeutic exercises, Therapeutic activity, Neuromuscular re-education, Balance training, Gait training, Patient/Family education, Self Care, Joint mobilization, Joint manipulation, Aquatic Therapy, Dry Needling, Spinal mobilization, Cryotherapy, Moist heat, Taping, Traction, Ionotophoresis 4mg /ml Dexamethasone, Manual therapy, and Re-evaluation   PLAN FOR NEXT SESSION: how was trip? Cont C spine mobility, UE strength and manual Prn    Manual.  Karie Mainland, PT 06/20/22 8:04 AM Phone: [phone] Fax: [phone]

## 2022-06-20 ENCOUNTER — Encounter: Payer: Self-pay | Admitting: Physical Therapy

## 2022-06-20 ENCOUNTER — Ambulatory Visit: Payer: No Typology Code available for payment source | Admitting: Physical Therapy

## 2022-06-20 DIAGNOSIS — M6281 Muscle weakness (generalized): Secondary | ICD-10-CM

## 2022-06-20 DIAGNOSIS — R293 Abnormal posture: Secondary | ICD-10-CM

## 2022-06-20 DIAGNOSIS — M542 Cervicalgia: Secondary | ICD-10-CM | POA: Diagnosis not present

## 2022-06-21 ENCOUNTER — Encounter: Payer: No Typology Code available for payment source | Admitting: Physical Therapy

## 2022-06-26 ENCOUNTER — Ambulatory Visit: Payer: No Typology Code available for payment source | Admitting: Physical Therapy

## 2022-06-26 ENCOUNTER — Encounter: Payer: Self-pay | Admitting: Physical Therapy

## 2022-06-26 DIAGNOSIS — M542 Cervicalgia: Secondary | ICD-10-CM

## 2022-06-26 DIAGNOSIS — R293 Abnormal posture: Secondary | ICD-10-CM

## 2022-06-26 DIAGNOSIS — M6281 Muscle weakness (generalized): Secondary | ICD-10-CM

## 2022-06-26 NOTE — Therapy (Signed)
OUTPATIENT PHYSICAL THERAPY TREATMENT NOTE   Patient Name: Rachel Cabrera MRN: [id number] DOB:Feb 16, 1965, 57 y.o., female Today's Date: 06/26/2022  PCP: Vernona Rieger, NP   REFERRING PROVIDER: Tressie Stalker, MD  END OF SESSION:   PT End of Session - 06/26/22 0713     Visit Number 6    Number of Visits 16    Date for PT Re-Evaluation 08/02/22    Authorization Type [employer]    PT Start Time 0715    PT Stop Time 0755    PT Time Calculation (min) 40 min                Past Medical History:  Diagnosis Date   GERD (gastroesophageal reflux disease)    Hyperlipidemia    Hypertension    Migraines    Night sweat    PMDD (premenstrual dysphoric disorder)    UTI (lower urinary tract infection)    Past Surgical History:  Procedure Laterality Date   CESAREAN SECTION  1989, 1992   COLONOSCOPY WITH PROPOFOL N/A 11/06/2017   Procedure: COLONOSCOPY WITH PROPOFOL;  Surgeon: Midge Minium, MD;  Location: [facility] ENDOSCOPY;  Service: Endoscopy;  Laterality: N/A;   TUBAL LIGATION  2004   Patient Active Problem List   Diagnosis Date Noted   Bacterial conjunctivitis of both eyes 04/11/2021   Flank pain 11/05/2019   Acute neck pain 04/15/2018   Elevated blood pressure reading 03/27/2018   Migraines 03/27/2018   Encounter for screening colonoscopy    Polyp of sigmoid colon    Benign neoplasm of descending colon    Benign neoplasm of ascending colon    Hyperlipidemia 10/03/2017   Vitamin D deficiency 09/21/2016    REFERRING DIAG: M54.2 (ICD-10-CM) - Cervicalgia  THERAPY DIAG:  Cervicalgia  Abnormal posture  Muscle weakness (generalized)  Rationale for Evaluation and Treatment Rehabilitation  PERTINENT HISTORY:  None indicated on file  PRECAUTIONS: none  SUBJECTIVE:                                                                                                                                                                                      SUBJECTIVE STATEMENT: A  little stiff this morning. Not pain.  We traveled back from [place] yesterday. The travel pillow helped. Pt has made a big difference.   PAIN:  Are you having pain? Yes: NPRS scale:0/10 Pain location: Top of the shoulders to sides of neck Pain description: stiffness, dull, aching Aggravating factors: Sit with neck turned for long periods, traveling in car, turning neck Relieving factors: Heat   OBJECTIVE: (objective measures completed at initial evaluation unless otherwise dated)   DIAGNOSTIC FINDINGS:  Nothing recent on  file   PATIENT SURVEYS:  FOTO 46; predicted 58 06/26/22:  58%    COGNITION: Overall cognitive status: Within functional limits for tasks assessed   SENSATION: WFL   POSTURE: rounded shoulders and forward head, GH head anteriorly rotated   PALPATION: Most TTP along bilat UTs (L worse than R), cervical paraspinals, suboccipitals (L worse than R) Cervical lateral glides WFL but with pain on L, PA mobs [facility] but tender        CERVICAL ROM:    Active ROM A/PROM (deg) eval AROM 06/19/22 AROM 06/26/22  Flexion 25 23, 55   Extension 45 56   Right lateral flexion 20* 37 40  Left lateral flexion 20* 38 42  Right rotation 35* 60 62  Left rotation 35* 55 * 62   (Blank rows = not tested) *  = concordant pain   UPPER EXTREMITY ROM: All motions grossly [facility]   Active ROM Right eval Left eval  Shoulder flexion      Shoulder extension      Shoulder abduction      Shoulder adduction      Shoulder extension      Shoulder internal rotation      Shoulder external rotation      Elbow flexion      Elbow extension      Wrist flexion      Wrist extension      Wrist ulnar deviation      Wrist radial deviation      Wrist pronation      Wrist supination       (Blank rows = not tested)   UPPER EXTREMITY MMT:   MMT Right eval Left eval Right 06/26/22 Left  06/26/22   Shoulder flexion 4 4    Shoulder extension 5 5    Shoulder abduction 4 4    Shoulder adduction         Shoulder internal rotation 4 4    Shoulder external rotation 3+ 3+ 4 4  Middle trapezius 3+ 3    Lower trapezius 3+ 3    Elbow flexion        Elbow extension        Wrist flexion        Wrist extension        Wrist ulnar deviation        Wrist radial deviation        Wrist pronation        Wrist supination        Grip strength         (Blank rows = not tested)   CERVICAL SPECIAL TESTS:  Neck flexor muscle endurance test: Positive, Spurling's test: Negative, and Distraction test: Negative     FUNCTIONAL TESTS:  <5 sec of deep neck flexor activation in supine 06/26/22: 30 sec deep neck flexor test    TODAY'S TREATMENT:                [facility] Adult PT Treatment:                                                DATE: 06/26/22 Therapeutic Exercise: UBE L1 2 min each way  Green band rows, ER, EXT, horizontal abduction Pec stretch in doorway  Levator stretch and upper trap strec Forward raise 2#  x 15 each  Scaption 2#  x 15 each  Open books DNF test 30 sec       [facility] Adult PT Treatment:                                                DATE: 06/20/22 Therapeutic Exercise: Nustep L5 UE and LE  5 min  Standing at wall: protraction/retraction x 10 , wall push up x 10 , semi circle band pulls 30 sec red loop Cervical sidebending /UT with added hand on top of her head  Levator scap x 30 sec x 3  Cervical SNAG rotation x 3  Sidelying open book x 30 sec hold x 2 each side  Manual Therapy: Trigger Point Dry Needling Treatment: Anne Ng, DPT  Pre-treatment instruction: Patient instructed on dry needling rationale, procedures, and possible side effects including pain during treatment (achy,cramping feeling), bruising, drop of blood, lightheadedness, nausea, sweating. Patient Consent Given: Yes Education handout provided: Yes Muscles treated: upper trap x 2  x bilateral  Treatment response/outcome: Twitch response elicited and Palpable decrease in muscle tension Post-treatment  instructions: Patient instructed to expect possible mild to moderate muscle soreness later today and/or tomorrow. Patient instructed in methods to reduce muscle soreness and to continue prescribed HEP. If patient was dry needled over the lung field, patient was instructed on signs and symptoms of pneumothorax and, however unlikely, to see immediate medical attention should they occur. Patient was also educated on signs and symptoms of infection and to seek medical attention should they occur. Patient verbalized understanding of these instructions and education.   Supine soft tissue work to posterior cervicals , upper trap, levator scapula, suboccipitals   Modalities: MHP 10 min -declined Self Care: Travel pillow    [facility] Adult PT Treatment:                                                DATE: 06/19/22 Therapeutic Exercise: UBE level 1 for 5 min  Standing bands row/scap retract and extension BTB x 20  Sidefacing diagonal  and ER GTB x 15 each side  Forward flexion GTB x 10 then alternating x 10 each  Horizontal abduction green x 15 with chin tuck supine  Book openings x 5  Sidelying shoulder scaption green band x 15  Pec stretch mid and high 2 x 30 sec each AROM check see above   [facility] Adult PT Treatment:                                                DATE: 06/14/22 Therapeutic Exercise: UBE 5 min level 1  Pec stretch mid and high 2 x 30 sec each Levator stretch seated x 2 each side  Upper trap seated x 2  each  Wall for posture, red band Horizontal abd x 15  Manual Therapy: Trigger Point Dry Needling Treatment: Pre-treatment instruction: Patient instructed on dry needling rationale, procedures, and possible side effects including pain during treatment (achy,cramping feeling), bruising, drop of blood, lightheadedness, nausea, sweating. Patient Consent Given: Yes Education handout provided: Yes Muscles treated: upper trap x 2   Treatment response/outcome:  Twitch response elicited and  Palpable decrease in muscle tension Post-treatment instructions: Patient instructed to expect possible mild to moderate muscle soreness later today and/or tomorrow. Patient instructed in methods to reduce muscle soreness and to continue prescribed HEP. If patient was dry needled over the lung field, patient was instructed on signs and symptoms of pneumothorax and, however unlikely, to see immediate medical attention should they occur. Patient was also educated on signs and symptoms of infection and to seek medical attention should they occur. Patient verbalized understanding of these instructions and education.   Soft tissue work to bilateral UT in prone                                                                                                           [facility] Adult PT Treatment:                                                DATE: 06/12/22 Therapeutic Exercise: UBE Level 1, 2 min each way Shoulder row red x 20 Shoulder ext red x 20  Shoulder ER and retract AROM x 10 then red band x 10 Shoulder ER in 45 deg abduction AROM x 10 then red band x 10 Pec stretch mid and high 2 x 30 sec each Levator stretch seated Supine chin tuck on towel x 10 Self Care:  Use of theracane for self TPR  DATE: 06/07/22  See HEP below   PATIENT EDUCATION:  Education details: TPDN, posture, stretch  Person educated: Patient Education method: Explanation, Demonstration, Verbal cues, and Handouts Education comprehension: verbalized understanding, returned demonstration, and needs further education   HOME EXERCISE PROGRAM: Access Code: [email or web address] URL: [email or web address] Date: 06/07/2022 Prepared by: Vernon Prey April Kirstie Peri   Exercises - Seated Scapular Retraction  - 1 x daily - 7 x weekly - 2 sets - 10 reps - Shoulder External Rotation and Scapular Retraction  - 1 x daily - 7 x weekly - 2 sets - 10 reps - Shoulder External Rotation in 45 Degrees Abduction  - 1 x daily - 7 x weekly - 2 sets -  10 reps - Standing Bilateral Low Shoulder Row with Anchored Resistance  - 1 x daily - 7 x weekly - 2 sets - 10 reps - Shoulder extension with resistance - Neutral  - 1 x daily - 7 x weekly - 2 sets - 10 reps - Doorway Pec Stretch at 90 Degrees Abduction  - 1 x daily - 7 x weekly - 2 sets - 30 sec hold - Doorway Pec Stretch at 60 Elevation  - 1 x daily - 7 x weekly - 2 sets - 30 sec hold - Standing Upper Trapezius Mobilization with Small Ball  - 1 x daily - 7 x weekly - 2 sets - 30 sec hold - Supine Suboccipital Release with Tennis Balls  - 1 x daily - 7 x weekly - 2 sets -  30 sec hold Added 06/12/22 - Supine Cervical Retraction with Towel  - 1 x daily - 7 x weekly - 1-2 sets - 10 reps - 5 hold - Gentle Levator Scapulae Stretch  - 1 x daily - 7 x weekly - 1 sets - 3 reps - 30 hold   Patient Education - Trigger Point Dry Needling   ASSESSMENT:   CLINICAL IMPRESSION: Pt reports her posture is much better and the pain intensity in her neck has decreased, also notes improved mobility. Her cervial AROM has significantly improved, Shoulder ER MMT improved, FOTO score reached target. Pt is pleased with progress. Progressed shoulder strengthening today to maximize patient's function. No increased pain reported. Will plan to focus UE strength and functional strength for remainder of POC. Pt to f/u with MD in 2 weeks.  Cont POC.    OBJECTIVE IMPAIRMENTS: decreased activity tolerance, decreased endurance, decreased mobility, decreased ROM, decreased strength, increased fascial restrictions, increased muscle spasms, impaired flexibility, impaired UE functional use, improper body mechanics, postural dysfunction, and pain.    ACTIVITY LIMITATIONS: lifting, sitting, and sleeping   PARTICIPATION LIMITATIONS: driving, community activity, and occupation   PERSONAL FACTORS: Fitness, Past/current experiences, and Time since onset of injury/illness/exacerbation are also affecting patient's functional outcome.     REHAB POTENTIAL: Good   CLINICAL DECISION MAKING: Stable/uncomplicated   EVALUATION COMPLEXITY: Low     GOALS: Goals reviewed with patient? Yes   SHORT TERM GOALS: Target date: 07/05/2022     Pt will be ind with initial HEP Baseline:  06/26/22: MET Goal status: ongoing    2.  Pt will have improved cervical rotation by >/=10 deg Baseline:  06/26/22: see hart Goal status: MET     LONG TERM GOALS: Target date: 08/02/2022     Pt will be ind with management and progression of HEP Baseline:  Goal status: INITIAL   2.  Pt will have improved FOTO score to >/=58 Baseline:  06/26/22: 58 Goal status: MET   3.  Pt will have increased cervical rotation and lateral flexion >/=15 deg from baseline Baseline:  06/26/22: see chart Goal status: MET    4.  Pt will report improved pain by >/=50% Baseline:  Goal status: INITIAL     PLAN:   PT FREQUENCY: 2x/week   PT DURATION: 8 weeks   PLANNED INTERVENTIONS: Therapeutic exercises, Therapeutic activity, Neuromuscular re-education, Balance training, Gait training, Patient/Family education, Self Care, Joint mobilization, Joint manipulation, Aquatic Therapy, Dry Needling, Spinal mobilization, Cryotherapy, Moist heat, Taping, Traction, Ionotophoresis 4mg /ml Dexamethasone, Manual therapy, and Re-evaluation   PLAN FOR NEXT SESSION: how was trip? Cont C spine mobility, UE strength and manual Prn  , cont strengthening   Manual.  Karie Mainland, PT 06/26/22 8:04 AM Phone: [phone] Fax: [phone]

## 2022-06-28 ENCOUNTER — Ambulatory Visit: Payer: No Typology Code available for payment source | Admitting: Physical Therapy

## 2022-07-03 ENCOUNTER — Ambulatory Visit: Payer: No Typology Code available for payment source | Admitting: Physical Therapy

## 2022-07-03 ENCOUNTER — Encounter: Payer: Self-pay | Admitting: Physical Therapy

## 2022-07-03 DIAGNOSIS — M6281 Muscle weakness (generalized): Secondary | ICD-10-CM

## 2022-07-03 DIAGNOSIS — R293 Abnormal posture: Secondary | ICD-10-CM

## 2022-07-03 DIAGNOSIS — M542 Cervicalgia: Secondary | ICD-10-CM

## 2022-07-03 NOTE — Therapy (Signed)
OUTPATIENT PHYSICAL THERAPY TREATMENT NOTE   Patient Name: Rachel Cabrera MRN: [id number] DOB:[birth year], 57 y.o., female Today's Date: 07/03/2022  PCP: Vernona Rieger, NP   REFERRING PROVIDER: Tressie Stalker, MD  END OF SESSION:   PT End of Session - 07/03/22 0802     Visit Number 7    Number of Visits 16    Date for PT Re-Evaluation 08/02/22    Authorization Type [employer]    PT Start Time 0800    PT Stop Time 0833    PT Time Calculation (min) 33 min                Past Medical History:  Diagnosis Date   GERD (gastroesophageal reflux disease)    Hyperlipidemia    Hypertension    Migraines    Night sweat    PMDD (premenstrual dysphoric disorder)    UTI (lower urinary tract infection)    Past Surgical History:  Procedure Laterality Date   CESAREAN SECTION  1989, 1992   COLONOSCOPY WITH PROPOFOL N/A 11/06/2017   Procedure: COLONOSCOPY WITH PROPOFOL;  Surgeon: Midge Minium, MD;  Location: [facility] ENDOSCOPY;  Service: Endoscopy;  Laterality: N/A;   TUBAL LIGATION  2004   Patient Active Problem List   Diagnosis Date Noted   Bacterial conjunctivitis of both eyes 04/11/2021   Flank pain 11/05/2019   Acute neck pain 04/15/2018   Elevated blood pressure reading 03/27/2018   Migraines 03/27/2018   Encounter for screening colonoscopy    Polyp of sigmoid colon    Benign neoplasm of descending colon    Benign neoplasm of ascending colon    Hyperlipidemia 10/03/2017   Vitamin D deficiency 09/21/2016    REFERRING DIAG: M54.2 (ICD-10-CM) - Cervicalgia  THERAPY DIAG:  Cervicalgia  Abnormal posture  Muscle weakness (generalized)  Rationale for Evaluation and Treatment Rehabilitation  PERTINENT HISTORY:  None indicated on file  PRECAUTIONS: none  SUBJECTIVE:                                                                                                                                                                                      SUBJECTIVE STATEMENT:  Just a little tender. I noticed that sitting seems to be the culprit of my pain.    PAIN:  Are you having pain? Yes: NPRS scale:2/10 Pain location: Top of the shoulders to sides of neck Pain description:tender  Aggravating factors: Sit with neck turned for long periods, traveling in car, turning neck Relieving factors: Heat   OBJECTIVE: (objective measures completed at initial evaluation unless otherwise dated)   DIAGNOSTIC FINDINGS:  Nothing recent on file   PATIENT SURVEYS:  FOTO 46; predicted  58 06/26/22:  58%    COGNITION: Overall cognitive status: Within functional limits for tasks assessed   SENSATION: WFL   POSTURE: rounded shoulders and forward head, GH head anteriorly rotated   PALPATION: Most TTP along bilat UTs (L worse than R), cervical paraspinals, suboccipitals (L worse than R) Cervical lateral glides WFL but with pain on L, PA mobs [facility] but tender        CERVICAL ROM:    Active ROM A/PROM (deg) eval AROM 06/19/22 AROM 06/26/22  Flexion 25 23, 55   Extension 45 56   Right lateral flexion 20* 37 40  Left lateral flexion 20* 38 42  Right rotation 35* 60 62  Left rotation 35* 55 * 62   (Blank rows = not tested) *  = concordant pain   UPPER EXTREMITY ROM: All motions grossly [facility]   Active ROM Right eval Left eval  Shoulder flexion      Shoulder extension      Shoulder abduction      Shoulder adduction      Shoulder extension      Shoulder internal rotation      Shoulder external rotation      Elbow flexion      Elbow extension      Wrist flexion      Wrist extension      Wrist ulnar deviation      Wrist radial deviation      Wrist pronation      Wrist supination       (Blank rows = not tested)   UPPER EXTREMITY MMT:   MMT Right eval Left eval Right 06/26/22 Left  06/26/22   Shoulder flexion 4 4    Shoulder extension 5 5    Shoulder abduction 4 4    Shoulder adduction        Shoulder internal rotation 4 4    Shoulder external  rotation 3+ 3+ 4 4  Middle trapezius 3+ 3    Lower trapezius 3+ 3    Elbow flexion        Elbow extension        Wrist flexion        Wrist extension        Wrist ulnar deviation        Wrist radial deviation        Wrist pronation        Wrist supination        Grip strength         (Blank rows = not tested)   CERVICAL SPECIAL TESTS:  Neck flexor muscle endurance test: Positive, Spurling's test: Negative, and Distraction test: Negative     FUNCTIONAL TESTS:  <5 sec of deep neck flexor activation in supine 06/26/22: 30 sec deep neck flexor test    TODAY'S TREATMENT:                [facility] Adult PT Treatment:                                                DATE: 07/03/22 Therapeutic Exercise: UBE L1 2.5 min each way  Green band rows, ER, EXT, horizontal abduction 10 x 2 each  Pec stretch in doorway  Levator stretch and upper trap strec Forward raise 3#  x 10 each  Scaption 3# x 10 each  OH press 8#  2 x 10 Wall push up x 10 Counter push up x 10- mod cues  Qped UE raise x 10- mod cues  Open books Upper trap and levator stretches    [facility] Adult PT Treatment:                                                DATE: 06/26/22 Therapeutic Exercise: UBE L1 2 min each way  Green band rows, ER, EXT, horizontal abduction Pec stretch in doorway  Levator stretch and upper trap strec Forward raise 2#  x 15 each  Scaption 2# x 15 each  Open books DNF test 30 sec       [facility] Adult PT Treatment:                                                DATE: 06/20/22 Therapeutic Exercise: Nustep L5 UE and LE  5 min  Standing at wall: protraction/retraction x 10 , wall push up x 10 , semi circle band pulls 30 sec red loop Cervical sidebending /UT with added hand on top of her head  Levator scap x 30 sec x 3  Cervical SNAG rotation x 3  Sidelying open book x 30 sec hold x 2 each side  Manual Therapy: Trigger Point Dry Needling Treatment: Anne Ng, DPT  Pre-treatment instruction: Patient  instructed on dry needling rationale, procedures, and possible side effects including pain during treatment (achy,cramping feeling), bruising, drop of blood, lightheadedness, nausea, sweating. Patient Consent Given: Yes Education handout provided: Yes Muscles treated: upper trap x 2  x bilateral  Treatment response/outcome: Twitch response elicited and Palpable decrease in muscle tension Post-treatment instructions: Patient instructed to expect possible mild to moderate muscle soreness later today and/or tomorrow. Patient instructed in methods to reduce muscle soreness and to continue prescribed HEP. If patient was dry needled over the lung field, patient was instructed on signs and symptoms of pneumothorax and, however unlikely, to see immediate medical attention should they occur. Patient was also educated on signs and symptoms of infection and to seek medical attention should they occur. Patient verbalized understanding of these instructions and education.   Supine soft tissue work to posterior cervicals , upper trap, levator scapula, suboccipitals   Modalities: MHP 10 min -declined Self Care: Travel pillow    [facility] Adult PT Treatment:                                                DATE: 06/19/22 Therapeutic Exercise: UBE level 1 for 5 min  Standing bands row/scap retract and extension BTB x 20  Sidefacing diagonal  and ER GTB x 15 each side  Forward flexion GTB x 10 then alternating x 10 each  Horizontal abduction green x 15 with chin tuck supine  Book openings x 5  Sidelying shoulder scaption green band x 15  Pec stretch mid and high 2 x 30 sec each AROM check see above   [facility] Adult PT Treatment:  DATE: 06/14/22 Therapeutic Exercise: UBE 5 min level 1  Pec stretch mid and high 2 x 30 sec each Levator stretch seated x 2 each side  Upper trap seated x 2  each  Wall for posture, red band Horizontal abd x 15  Manual Therapy: Trigger Point  Dry Needling Treatment: Pre-treatment instruction: Patient instructed on dry needling rationale, procedures, and possible side effects including pain during treatment (achy,cramping feeling), bruising, drop of blood, lightheadedness, nausea, sweating. Patient Consent Given: Yes Education handout provided: Yes Muscles treated: upper trap x 2   Treatment response/outcome: Twitch response elicited and Palpable decrease in muscle tension Post-treatment instructions: Patient instructed to expect possible mild to moderate muscle soreness later today and/or tomorrow. Patient instructed in methods to reduce muscle soreness and to continue prescribed HEP. If patient was dry needled over the lung field, patient was instructed on signs and symptoms of pneumothorax and, however unlikely, to see immediate medical attention should they occur. Patient was also educated on signs and symptoms of infection and to seek medical attention should they occur. Patient verbalized understanding of these instructions and education.   Soft tissue work to bilateral UT in prone                                                                                                           [facility] Adult PT Treatment:                                                DATE: 06/12/22 Therapeutic Exercise: UBE Level 1, 2 min each way Shoulder row red x 20 Shoulder ext red x 20  Shoulder ER and retract AROM x 10 then red band x 10 Shoulder ER in 45 deg abduction AROM x 10 then red band x 10 Pec stretch mid and high 2 x 30 sec each Levator stretch seated Supine chin tuck on towel x 10 Self Care:  Use of theracane for self TPR  DATE: 06/07/22  See HEP below   PATIENT EDUCATION:  Education details: TPDN, posture, stretch  Person educated: Patient Education method: Explanation, Demonstration, Verbal cues, and Handouts Education comprehension: verbalized understanding, returned demonstration, and needs further education   HOME EXERCISE  PROGRAM: Access Code: [email or web address] URL: [email or web address] Date: 06/07/2022 Prepared by: Vernon Prey April Kirstie Peri   Exercises - Seated Scapular Retraction  - 1 x daily - 7 x weekly - 2 sets - 10 reps - Shoulder External Rotation and Scapular Retraction  - 1 x daily - 7 x weekly - 2 sets - 10 reps - Shoulder External Rotation in 45 Degrees Abduction  - 1 x daily - 7 x weekly - 2 sets - 10 reps - Standing Bilateral Low Shoulder Row with Anchored Resistance  - 1 x daily - 7 x weekly - 2 sets - 10 reps - Shoulder extension with resistance - Neutral  - 1 x daily - 7 x weekly -  2 sets - 10 reps - Doorway Pec Stretch at 90 Degrees Abduction  - 1 x daily - 7 x weekly - 2 sets - 30 sec hold - Doorway Pec Stretch at 60 Elevation  - 1 x daily - 7 x weekly - 2 sets - 30 sec hold - Standing Upper Trapezius Mobilization with Small Ball  - 1 x daily - 7 x weekly - 2 sets - 30 sec hold - Supine Suboccipital Release with Tennis Balls  - 1 x daily - 7 x weekly - 2 sets - 30 sec hold Added 06/12/22 - Supine Cervical Retraction with Towel  - 1 x daily - 7 x weekly - 1-2 sets - 10 reps - 5 hold - Gentle Levator Scapulae Stretch  - 1 x daily - 7 x weekly - 1 sets - 3 reps - 30 hold   Patient Education - Trigger Point Dry Needling   ASSESSMENT:   CLINICAL IMPRESSION: Introduced UE closed chain strengthening today with mod cues for correct form and posture. She tolerated the progression well and felt good at end of session. Will assess response next visit.  Progressed shoulder strengthening today to maximize patient's function. No increased pain reported. Will plan to focus UE strength and functional strength for remainder of POC. Pt to f/u with MD in 1-2 weeks.  Cont POC.    OBJECTIVE IMPAIRMENTS: decreased activity tolerance, decreased endurance, decreased mobility, decreased ROM, decreased strength, increased fascial restrictions, increased muscle spasms, impaired flexibility, impaired UE  functional use, improper body mechanics, postural dysfunction, and pain.    ACTIVITY LIMITATIONS: lifting, sitting, and sleeping   PARTICIPATION LIMITATIONS: driving, community activity, and occupation   PERSONAL FACTORS: Fitness, Past/current experiences, and Time since onset of injury/illness/exacerbation are also affecting patient's functional outcome.    REHAB POTENTIAL: Good   CLINICAL DECISION MAKING: Stable/uncomplicated   EVALUATION COMPLEXITY: Low     GOALS: Goals reviewed with patient? Yes   SHORT TERM GOALS: Target date: 07/05/2022     Pt will be ind with initial HEP Baseline:  06/26/22: MET Goal status: met   2.  Pt will have improved cervical rotation by >/=10 deg Baseline:  06/26/22: see hart Goal status: MET     LONG TERM GOALS: Target date: 08/02/2022     Pt will be ind with management and progression of HEP Baseline:  Goal status: INITIAL   2.  Pt will have improved FOTO score to >/=58 Baseline:  06/26/22: 58 Goal status: MET   3.  Pt will have increased cervical rotation and lateral flexion >/=15 deg from baseline Baseline:  06/26/22: see chart Goal status: MET    4.  Pt will report improved pain by >/=50% Baseline:  Goal status: INITIAL     PLAN:   PT FREQUENCY: 2x/week   PT DURATION: 8 weeks   PLANNED INTERVENTIONS: Therapeutic exercises, Therapeutic activity, Neuromuscular re-education, Balance training, Gait training, Patient/Family education, Self Care, Joint mobilization, Joint manipulation, Aquatic Therapy, Dry Needling, Spinal mobilization, Cryotherapy, Moist heat, Taping, Traction, Ionotophoresis 4mg /ml Dexamethasone, Manual therapy, and Re-evaluation   PLAN FOR NEXT SESSION:  Cont C spine mobility, UE strength and manual Prn  , cont strengthening , assess response to closed chain strengthening.   Manual.  Jannette Spanner, PTA 07/03/22 11:48 AM Phone: [phone] Fax: [phone]

## 2022-07-05 ENCOUNTER — Encounter: Payer: No Typology Code available for payment source | Admitting: Physical Therapy

## 2022-07-06 ENCOUNTER — Ambulatory Visit: Payer: No Typology Code available for payment source | Admitting: Physical Therapy

## 2022-07-06 DIAGNOSIS — M6281 Muscle weakness (generalized): Secondary | ICD-10-CM

## 2022-07-06 DIAGNOSIS — M542 Cervicalgia: Secondary | ICD-10-CM | POA: Diagnosis not present

## 2022-07-06 DIAGNOSIS — R293 Abnormal posture: Secondary | ICD-10-CM

## 2022-07-06 NOTE — Therapy (Signed)
OUTPATIENT PHYSICAL THERAPY TREATMENT NOTE   Patient Name: Rachel Cabrera MRN: [id number] DOB:1966-01-04, 57 y.o., female Today's Date: 07/06/2022  PCP: Vernona Rieger, NP   REFERRING PROVIDER: Tressie Stalker, MD  END OF SESSION:   PT End of Session - 07/06/22 0717     Visit Number 8    Number of Visits 16    Date for PT Re-Evaluation 08/02/22    Authorization Type [employer]et[employer]    PT Start Time 0715    PT Stop Time 0755    PT Time Calculation (min) 40 min                Past Medical History:  Diagnosis Date   GERD (gastroesophageal reflux disease)    Hyperlipidemia    Hypertension    Migraines    Night sweat    PMDD (premenstrual dysphoric disorder)    UTI (lower urinary tract infection)    Past Surgical History:  Procedure Laterality Date   CESAREAN SECTION  1989, 1992   COLONOSCOPY WITH PROPOFOL N/A 11/06/2017   Procedure: COLONOSCOPY WITH PROPOFOL;  Surgeon: Midge Minium, MD;  Location: [facility] ENDOSCOPY;  Service: Endoscopy;  Laterality: N/A;   TUBAL LIGATION  2004   Patient Active Problem List   Diagnosis Date Noted   Bacterial conjunctivitis of both eyes 04/11/2021   Flank pain 11/05/2019   Acute neck pain 04/15/2018   Elevated blood pressure reading 03/27/2018   Migraines 03/27/2018   Encounter for screening colonoscopy    Polyp of sigmoid colon    Benign neoplasm of descending colon    Benign neoplasm of ascending colon    Hyperlipidemia 10/03/2017   Vitamin D deficiency 09/21/2016    REFERRING DIAG: M54.2 (ICD-10-CM) - Cervicalgia  THERAPY DIAG:  Cervicalgia  Abnormal posture  Muscle weakness (generalized)  Rationale for Evaluation and Treatment Rehabilitation  PERTINENT HISTORY:  None indicated on file  PRECAUTIONS: none  SUBJECTIVE:                                                                                                                                                                                      SUBJECTIVE STATEMENT: I  did the wall push ups and the counter push ups. I am feeling it this morning.  Maybe I can get more dry needling next week.    PAIN:  Are you having pain? Yes: NPRS scale:3/10 Pain location: Top of the shoulders to sides of neck Pain description:tender  Aggravating factors: Sit with neck turned for long periods, traveling in car, turning neck Relieving factors: Heat   OBJECTIVE: (objective measures completed at initial evaluation unless otherwise dated)   DIAGNOSTIC FINDINGS:  Nothing  recent on file   PATIENT SURVEYS:  FOTO 46; predicted 58 06/26/22:  58%    COGNITION: Overall cognitive status: Within functional limits for tasks assessed   SENSATION: WFL   POSTURE: rounded shoulders and forward head, GH head anteriorly rotated   PALPATION: Most TTP along bilat UTs (L worse than R), cervical paraspinals, suboccipitals (L worse than R) Cervical lateral glides WFL but with pain on L, PA mobs [facility] but tender        CERVICAL ROM:    Active ROM A/PROM (deg) eval AROM 06/19/22 AROM 06/26/22  Flexion 25 23, 55   Extension 45 56   Right lateral flexion 20* 37 40  Left lateral flexion 20* 38 42  Right rotation 35* 60 62  Left rotation 35* 55 * 62   (Blank rows = not tested) *  = concordant pain   UPPER EXTREMITY ROM: All motions grossly [facility]   Active ROM Right eval Left eval  Shoulder flexion      Shoulder extension      Shoulder abduction      Shoulder adduction      Shoulder extension      Shoulder internal rotation      Shoulder external rotation      Elbow flexion      Elbow extension      Wrist flexion      Wrist extension      Wrist ulnar deviation      Wrist radial deviation      Wrist pronation      Wrist supination       (Blank rows = not tested)   UPPER EXTREMITY MMT:   MMT Right eval Left eval Right 06/26/22 Left  06/26/22   Shoulder flexion 4 4    Shoulder extension 5 5    Shoulder abduction 4 4    Shoulder adduction        Shoulder internal  rotation 4 4    Shoulder external rotation 3+ 3+ 4 4  Middle trapezius 3+ 3    Lower trapezius 3+ 3    Elbow flexion        Elbow extension        Wrist flexion        Wrist extension        Wrist ulnar deviation        Wrist radial deviation        Wrist pronation        Wrist supination        Grip strength         (Blank rows = not tested)   CERVICAL SPECIAL TESTS:  Neck flexor muscle endurance test: Positive, Spurling's test: Negative, and Distraction test: Negative     FUNCTIONAL TESTS:  <5 sec of deep neck flexor activation in supine 06/26/22: 30 sec deep neck flexor test    TODAY'S TREATMENT:                [facility] Adult PT Treatment:                                                DATE: 07/06/22 Therapeutic Exercise: UBE L1 x 3 min each way  Green band rows, ER, EXT, horizontal abduction 10 x 2 each  Pec stretch in doorway  Levator stretch and upper trap stretch Scaption 3#  x 10 each  OH press 8#  2 x 10 Qped UE raise x 10- mod cues  Qped LE raise x 10 - mod cues  Upper trap and levator stretches Manual Upper trap TPR and STW  Passive upper trap stretches     [facility] Adult PT Treatment:                                                DATE: 07/03/22 Therapeutic Exercise: UBE L1 2.5 min each way  Green band rows, ER, EXT, horizontal abduction 10 x 2 each  Pec stretch in doorway  Levator stretch and upper trap strec Forward raise 3#  x 10 each  Scaption 3# x 10 each  OH press 8#  2 x 10 Wall push up x 10 Counter push up x 10- mod cues  Qped UE raise x 10- mod cues  Open books Upper trap and levator stretches    [facility] Adult PT Treatment:                                                DATE: 06/26/22 Therapeutic Exercise: UBE L1 2 min each way  Green band rows, ER, EXT, horizontal abduction Pec stretch in doorway  Levator stretch and upper trap strec Forward raise 2#  x 15 each  Scaption 2# x 15 each  Open books DNF test 30 sec        DATE: 06/07/22   See HEP below   PATIENT EDUCATION:  Education details: TPDN, posture, stretch  Person educated: Patient Education method: Explanation, Demonstration, Verbal cues, and Handouts Education comprehension: verbalized understanding, returned demonstration, and needs further education   HOME EXERCISE PROGRAM: Access Code: [email or web address] URL: [email or web address] Date: 06/07/2022 Prepared by: Vernon Prey April Kirstie Peri   Exercises - Seated Scapular Retraction  - 1 x daily - 7 x weekly - 2 sets - 10 reps - Shoulder External Rotation and Scapular Retraction  - 1 x daily - 7 x weekly - 2 sets - 10 reps - Shoulder External Rotation in 45 Degrees Abduction  - 1 x daily - 7 x weekly - 2 sets - 10 reps - Standing Bilateral Low Shoulder Row with Anchored Resistance  - 1 x daily - 7 x weekly - 2 sets - 10 reps - Shoulder extension with resistance - Neutral  - 1 x daily - 7 x weekly - 2 sets - 10 reps - Doorway Pec Stretch at 90 Degrees Abduction  - 1 x daily - 7 x weekly - 2 sets - 30 sec hold - Doorway Pec Stretch at 60 Elevation  - 1 x daily - 7 x weekly - 2 sets - 30 sec hold - Standing Upper Trapezius Mobilization with Small Ball  - 1 x daily - 7 x weekly - 2 sets - 30 sec hold - Supine Suboccipital Release with Tennis Balls  - 1 x daily - 7 x weekly - 2 sets - 30 sec hold Added 06/12/22 - Supine Cervical Retraction with Towel  - 1 x daily - 7 x weekly - 1-2 sets - 10 reps - 5 hold - Gentle Levator Scapulae Stretch  - 1 x daily - 7 x weekly - 1 sets -  3 reps - 30 hold 07/06/22 - Quadruped Alternating Arm Lift  - 1 x daily - 7 x weekly - 1 sets - 10 reps - Quadruped Alternating Leg Extensions  - 1 x daily - 7 x weekly - 1 sets - 10 reps   Patient Education - Trigger Point Dry Needling   ASSESSMENT:   CLINICAL IMPRESSION: Pt reports she did well after last session however after she tried the wall and counter push ups yesterday at home, she woke up this morning more sore, 3/110 soreness  reported. Continued with UE closed chain strengthening today in quadruped. Opted to hold counter / wall push ups today and see if she returns to baseline. Her soreness was improved after manual STW and TPR to upper traps. Updated HEP with quadruped.  Will plan to focus UE strength and functional strength for remainder of POC. Pt to f/u with MD in 1 week. She is interested in TPDN next week.   Cont POC.    OBJECTIVE IMPAIRMENTS: decreased activity tolerance, decreased endurance, decreased mobility, decreased ROM, decreased strength, increased fascial restrictions, increased muscle spasms, impaired flexibility, impaired UE functional use, improper body mechanics, postural dysfunction, and pain.    ACTIVITY LIMITATIONS: lifting, sitting, and sleeping   PARTICIPATION LIMITATIONS: driving, community activity, and occupation   PERSONAL FACTORS: Fitness, Past/current experiences, and Time since onset of injury/illness/exacerbation are also affecting patient's functional outcome.    REHAB POTENTIAL: Good   CLINICAL DECISION MAKING: Stable/uncomplicated   EVALUATION COMPLEXITY: Low     GOALS: Goals reviewed with patient? Yes   SHORT TERM GOALS: Target date: 07/05/2022     Pt will be ind with initial HEP Baseline:  06/26/22: MET Goal status: met   2.  Pt will have improved cervical rotation by >/=10 deg Baseline:  06/26/22: see hart Goal status: MET     LONG TERM GOALS: Target date: 08/02/2022     Pt will be ind with management and progression of HEP Baseline:  Goal status: INITIAL   2.  Pt will have improved FOTO score to >/=58 Baseline:  06/26/22: 58 Goal status: MET   3.  Pt will have increased cervical rotation and lateral flexion >/=15 deg from baseline Baseline:  06/26/22: see chart Goal status: MET    4.  Pt will report improved pain by >/=50% Baseline:  Goal status: INITIAL     PLAN:   PT FREQUENCY: 2x/week   PT DURATION: 8 weeks   PLANNED INTERVENTIONS:  Therapeutic exercises, Therapeutic activity, Neuromuscular re-education, Balance training, Gait training, Patient/Family education, Self Care, Joint mobilization, Joint manipulation, Aquatic Therapy, Dry Needling, Spinal mobilization, Cryotherapy, Moist heat, Taping, Traction, Ionotophoresis 4mg /ml Dexamethasone, Manual therapy, and Re-evaluation   PLAN FOR NEXT SESSION:  Cont C spine mobility, UE strength and manual Prn  , cont strengthening , assess response to closed chain strengthening.   Manual.  Jannette Spanner, PTA 07/06/22 10:39 AM Phone: [phone] Fax: [phone]

## 2022-07-18 ENCOUNTER — Encounter: Payer: Self-pay | Admitting: Physical Therapy

## 2022-07-18 ENCOUNTER — Ambulatory Visit: Payer: 59 | Attending: Neurosurgery | Admitting: Physical Therapy

## 2022-07-18 DIAGNOSIS — M6281 Muscle weakness (generalized): Secondary | ICD-10-CM | POA: Insufficient documentation

## 2022-07-18 DIAGNOSIS — M542 Cervicalgia: Secondary | ICD-10-CM | POA: Diagnosis present

## 2022-07-18 DIAGNOSIS — R293 Abnormal posture: Secondary | ICD-10-CM | POA: Diagnosis present

## 2022-07-18 NOTE — Therapy (Signed)
OUTPATIENT PHYSICAL THERAPY TREATMENT NOTE DISCHARGED    Patient Name: Rachel Cabrera MRN: [id number] DOB:[birth year], 57 y.o., female Today's Date: 07/18/2022  PCP: Vernona Rieger, NP   REFERRING PROVIDER: Tressie Stalker, MD  END OF SESSION:   PT End of Session - 07/18/22 0848     Visit Number 9    Number of Visits 16    Date for PT Re-Evaluation 08/02/22    Authorization Type [employer]    PT Start Time [phone]    PT Stop Time 0930    PT Time Calculation (min) 44 min    Activity Tolerance Patient tolerated treatment well    Behavior During Therapy [facility] for tasks assessed/performed                 Past Medical History:  Diagnosis Date   GERD (gastroesophageal reflux disease)    Hyperlipidemia    Hypertension    Migraines    Night sweat    PMDD (premenstrual dysphoric disorder)    UTI (lower urinary tract infection)    Past Surgical History:  Procedure Laterality Date   CESAREAN SECTION  1989, 1992   COLONOSCOPY WITH PROPOFOL N/A 11/06/2017   Procedure: COLONOSCOPY WITH PROPOFOL;  Surgeon: Midge Minium, MD;  Location: [facility] ENDOSCOPY;  Service: Endoscopy;  Laterality: N/A;   TUBAL LIGATION  2004   Patient Active Problem List   Diagnosis Date Noted   Bacterial conjunctivitis of both eyes 04/11/2021   Flank pain 11/05/2019   Acute neck pain 04/15/2018   Elevated blood pressure reading 03/27/2018   Migraines 03/27/2018   Encounter for screening colonoscopy    Polyp of sigmoid colon    Benign neoplasm of descending colon    Benign neoplasm of ascending colon    Hyperlipidemia 10/03/2017   Vitamin D deficiency 09/21/2016    REFERRING DIAG: M54.2 (ICD-10-CM) - Cervicalgia  THERAPY DIAG:  Cervicalgia  Abnormal posture  Muscle weakness (generalized)  Rationale for Evaluation and Treatment Rehabilitation  PERTINENT HISTORY:  None indicated on file  PRECAUTIONS: none  SUBJECTIVE:                                                                                                                                                                                       SUBJECTIVE STATEMENT: The only problem I still have is my ear.  When I lie on it or sit a certain way I can get to quit hurting. Saw the PA.  She showed me the XR nothing was alarming.   PAIN:  Are you having pain? Yes: NPRS scale:3/10 Pain location: Top of the shoulders to sides of neck Pain description:tender  Aggravating factors: Sit  with neck turned for long periods, traveling in car, turning neck Relieving factors: Heat   OBJECTIVE: (objective measures completed at initial evaluation unless otherwise dated)   DIAGNOSTIC FINDINGS:  Nothing recent on file   PATIENT SURVEYS:  FOTO 46; predicted 58 06/26/22:  58%  07/18/22: 67%   COGNITION: Overall cognitive status: Within functional limits for tasks assessed   SENSATION: WFL   POSTURE: rounded shoulders and forward head, GH head anteriorly rotated   PALPATION: Most TTP along bilat UTs (L worse than R), cervical paraspinals, suboccipitals (L worse than R) Cervical lateral glides WFL but with pain on L, PA mobs [facility] but tender        CERVICAL ROM:    Active ROM A/PROM (deg) eval AROM 06/19/22 AROM 06/26/22 AROM 07/18/22  Flexion 25 23, 55  62  Extension 45 56  60  Right lateral flexion 20* 37 40 40  Left lateral flexion 20* 38 42 40  Right rotation 35* 60 62 70  Left rotation 35* 55 * 62 68   (Blank rows = not tested) *  = concordant pain   UPPER EXTREMITY ROM: All motions grossly [facility]   Active ROM Right eval Left eval  Shoulder flexion      Shoulder extension      Shoulder abduction      Shoulder adduction      Shoulder extension      Shoulder internal rotation      Shoulder external rotation      Elbow flexion      Elbow extension      Wrist flexion      Wrist extension      Wrist ulnar deviation      Wrist radial deviation      Wrist pronation      Wrist supination       (Blank rows = not tested)    UPPER EXTREMITY MMT:   MMT Right eval Left eval Right 06/26/22 Left  06/26/22   Shoulder flexion 4 4    Shoulder extension 5 5    Shoulder abduction 4 4    Shoulder adduction        Shoulder internal rotation 4 4    Shoulder external rotation 3+ 3+ 4 4  Middle trapezius 3+ 3    Lower trapezius 3+ 3    Elbow flexion        Elbow extension        Wrist flexion        Wrist extension        Wrist ulnar deviation        Wrist radial deviation        Wrist pronation        Wrist supination        Grip strength         (Blank rows = not tested)   CERVICAL SPECIAL TESTS:  Neck flexor muscle endurance test: Positive, Spurling's test: Negative, and Distraction test: Negative     FUNCTIONAL TESTS:  <5 sec of deep neck flexor activation in supine 06/26/22: 30 sec deep neck flexor test    TODAY'S TREATMENT:    [facility] Adult PT Treatment:                                                DATE: 07/18/22 Therapeutic Exercise: NuStep L6 UE  and LE for 6 min  Quadruped bird dog  x 8 UE and LE  Quadruped 2 lbs horizontal abd x 10  Quadruped flexion red band anchored under opposite knee x 10  MMT, ROM  Manual Therapy: Supine soft tissue work to posterior cervicals, uppoer traps and levator scap  PROM rotation Self Care: Resources Plan of care, discharge and HEP progression                 [facility] Adult PT Treatment:                                                DATE: 07/06/22 Therapeutic Exercise: UBE L1 x 3 min each way  Green band rows, ER, EXT, horizontal abduction 10 x 2 each  Pec stretch in doorway  Levator stretch and upper trap stretch Scaption 3# x 10 each  OH press 8#  2 x 10 Qped UE raise x 10- mod cues  Qped LE raise x 10 - mod cues  Upper trap and levator stretches Manual Upper trap TPR and STW  Passive upper trap stretches     [facility] Adult PT Treatment:                                                DATE: 07/03/22 Therapeutic Exercise: UBE L1 2.5 min each way  Green  band rows, ER, EXT, horizontal abduction 10 x 2 each  Pec stretch in doorway  Levator stretch and upper trap strec Forward raise 3#  x 10 each  Scaption 3# x 10 each  OH press 8#  2 x 10 Wall push up x 10 Counter push up x 10- mod cues  Qped UE raise x 10- mod cues  Open books Upper trap and levator stretches    [facility] Adult PT Treatment:                                                DATE: 06/26/22 Therapeutic Exercise: UBE L1 2 min each way  Green band rows, ER, EXT, horizontal abduction Pec stretch in doorway  Levator stretch and upper trap strec Forward raise 2#  x 15 each  Scaption 2# x 15 each  Open books DNF test 30 sec        DATE: 06/07/22  See HEP below   PATIENT EDUCATION:  Education details: TPDN, posture, stretch  Person educated: Patient Education method: Explanation, Demonstration, Verbal cues, and Handouts Education comprehension: verbalized understanding, returned demonstration, and needs further education   HOME EXERCISE PROGRAM: Access Code: [email or web address] URL: [email or web address] Date: 06/07/2022 Prepared by: Vernon Prey April Kirstie Peri   Exercises - Seated Scapular Retraction  - 1 x daily - 7 x weekly - 2 sets - 10 reps - Shoulder External Rotation and Scapular Retraction  - 1 x daily - 7 x weekly - 2 sets - 10 reps - Shoulder External Rotation in 45 Degrees Abduction  - 1 x daily - 7 x weekly - 2 sets - 10 reps - Standing Bilateral Low Shoulder Row with Anchored Resistance  - 1 x daily -  7 x weekly - 2 sets - 10 reps - Shoulder extension with resistance - Neutral  - 1 x daily - 7 x weekly - 2 sets - 10 reps - Doorway Pec Stretch at 90 Degrees Abduction  - 1 x daily - 7 x weekly - 2 sets - 30 sec hold - Doorway Pec Stretch at 60 Elevation  - 1 x daily - 7 x weekly - 2 sets - 30 sec hold - Standing Upper Trapezius Mobilization with Small Ball  - 1 x daily - 7 x weekly - 2 sets - 30 sec hold - Supine Suboccipital Release with Tennis Balls   - 1 x daily - 7 x weekly - 2 sets - 30 sec hold Added 06/12/22 - Supine Cervical Retraction with Towel  - 1 x daily - 7 x weekly - 1-2 sets - 10 reps - 5 hold - Gentle Levator Scapulae Stretch  - 1 x daily - 7 x weekly - 1 sets - 3 reps - 30 hold 07/06/22 - Quadruped Alternating Arm Lift  - 1 x daily - 7 x weekly - 1 sets - 10 reps - Quadruped Alternating Leg Extensions  - 1 x daily - 7 x weekly - 1 sets - 10 reps   Patient Education - Trigger Point Dry Needling   ASSESSMENT:   CLINICAL IMPRESSION: Patient has met her goals and reports near resolution of pain . She has occasional stiffness in her neck off and on but nothing like she had initially.  Her FOTO score has surpassed the goal set in less time. She does like dry needling , gave her resources for this as well as massage therapists for continued care/  She is discharged from PT at this time.   OBJECTIVE IMPAIRMENTS: decreased activity tolerance, decreased endurance, decreased mobility, decreased ROM, decreased strength, increased fascial restrictions, increased muscle spasms, impaired flexibility, impaired UE functional use, improper body mechanics, postural dysfunction, and pain.    ACTIVITY LIMITATIONS: lifting, sitting, and sleeping   PARTICIPATION LIMITATIONS: driving, community activity, and occupation   PERSONAL FACTORS: Fitness, Past/current experiences, and Time since onset of injury/illness/exacerbation are also affecting patient's functional outcome.    REHAB POTENTIAL: Good   CLINICAL DECISION MAKING: Stable/uncomplicated   EVALUATION COMPLEXITY: Low     GOALS: Goals reviewed with patient? Yes   SHORT TERM GOALS: Target date: 07/05/2022     Pt will be ind with initial HEP Baseline:  06/26/22: MET Goal status: met   2.  Pt will have improved cervical rotation by >/=10 deg Baseline:  06/26/22: see hart Goal status: MET     LONG TERM GOALS: Target date: 08/02/2022     Pt will be ind with management and  progression of HEP Baseline:  Goal status:MET   2.  Pt will have improved FOTO score to >/=58 Baseline:  06/26/22: 58, 07/18/22 67% Goal status: MET   3.  Pt will have increased cervical rotation and lateral flexion >/=15 deg from baseline Baseline:  06/26/22: see chart Goal status: MET    4.  Pt will report improved pain by >/=50% Baseline:  Goal status: MET    PLAN:   PT FREQUENCY: 2x/week   PT DURATION: 8 weeks   PLANNED INTERVENTIONS: Therapeutic exercises, Therapeutic activity, Neuromuscular re-education, Balance training, Gait training, Patient/Family education, Self Care, Joint mobilization, Joint manipulation, Aquatic Therapy, Dry Needling, Spinal mobilization, Cryotherapy, Moist heat, Taping, Traction, Ionotophoresis 4mg /ml Dexamethasone, Manual therapy, and Re-evaluation   PLAN FOR NEXT SESSION:  NA,  DC   Karie Mainland, PT 07/18/22 9:42 AM Phone: [phone] Fax: [phone]   PHYSICAL THERAPY DISCHARGE SUMMARY  Visits from Start of Care: 9  Current functional level related to goals / functional outcomes: See above    Remaining deficits: None    Education / Equipment: HEP, posture, resources for cash based DN and massage therapy   Patient agrees to discharge. Patient goals were met. Patient is being discharged due to being pleased with the current functional level.

## 2022-07-25 ENCOUNTER — Ambulatory Visit: Payer: 59 | Admitting: Physical Therapy

## 2023-04-19 LAB — HM MAMMOGRAPHY

## 2023-04-30 ENCOUNTER — Ambulatory Visit
Admission: RE | Admit: 2023-04-30 | Discharge: 2023-04-30 | Disposition: A | Discharge status: Home / Self Care | Source: Ambulatory Visit | Attending: Family Medicine | Admitting: Family Medicine

## 2023-04-30 VITALS — BP 144/88 | HR 69 | Temp 98.8°F | Resp 10

## 2023-04-30 DIAGNOSIS — H6992 Unspecified Eustachian tube disorder, left ear: Secondary | ICD-10-CM

## 2023-04-30 DIAGNOSIS — J3089 Other allergic rhinitis: Secondary | ICD-10-CM

## 2023-04-30 MED ORDER — CETIRIZINE HCL 10 MG PO TABS: 10.0000 mg | ORAL_TABLET | Freq: Every day | ORAL | 2 refills | Status: AC

## 2023-04-30 MED ORDER — AZELASTINE HCL 0.1 % NA SOLN: 1.0000 | Freq: Two times a day (BID) | NASAL | 0 refills | Status: AC

## 2023-04-30 MED ORDER — PREDNISONE 50 MG PO TABS: ORAL_TABLET | ORAL | 0 refills | Status: AC

## 2023-04-30 NOTE — ED Triage Notes (Signed)
 Pt reports ear pain on the left side, pressure in the cheek bone and behind the ear, x 3 days.

## 2023-04-30 NOTE — ED Provider Notes (Signed)
 [facility]    CSN: [id number] Arrival date & time: 04/30/23  1244      History   Chief Complaint Chief Complaint  Patient presents with   Facial Pain    Inner ear pain & behind ear pain. Cheek pain. - Entered by patient    HPI Rachel Cabrera is a 58 y.o. female.   Presenting today with 3-day history of left ear pain, left sinus pressure and nasal congestion.  Denies fever, chills, chest pain, shortness of breath, abdominal pain, vomiting or diarrhea.  History of seasonal allergies, eustachian tube dysfunction.  So far not trying anything over-the-counter for symptoms.    Past Medical History:  Diagnosis Date   GERD (gastroesophageal reflux disease)    Hyperlipidemia    Hypertension    Migraines    Night sweat    PMDD (premenstrual dysphoric disorder)    UTI (lower urinary tract infection)     Patient Active Problem List   Diagnosis Date Noted   Bacterial conjunctivitis of both eyes 04/11/2021   Flank pain 11/05/2019   Acute neck pain 04/15/2018   Elevated blood pressure reading 03/27/2018   Migraines 03/27/2018   Encounter for screening colonoscopy    Polyp of sigmoid colon    Benign neoplasm of descending colon    Benign neoplasm of ascending colon    Hyperlipidemia 10/03/2017   Vitamin D deficiency 09/21/2016    Past Surgical History:  Procedure Laterality Date   CESAREAN SECTION  1989, 1992   COLONOSCOPY WITH PROPOFOL N/A 11/06/2017   Procedure: COLONOSCOPY WITH PROPOFOL;  Surgeon: Midge Minium, MD;  Location: [facility] ENDOSCOPY;  Service: Endoscopy;  Laterality: N/A;   TUBAL LIGATION  2004    OB History     Gravida  2   Para  2   Term  2   Preterm      AB      Living  2      SAB      IAB      Ectopic      Multiple      Live Births  2            Home Medications    Prior to Admission medications   Medication Sig Start Date End Date Taking? Authorizing Provider  azelastine (ASTELIN) 0.1 % nasal spray Place 1 spray  into both nostrils 2 (two) times daily. Use in each nostril as directed 04/30/23  Yes Particia Nearing, PA-C  carbamide peroxide (DEBROX) 6.5 % OTIC solution Place 5 drops into both ears 2 (two) times daily. Patient not taking: Reported on 06/07/2022 08/08/21   Particia Nearing, PA-C  cetirizine (ZYRTEC ALLERGY) 10 MG tablet Take 1 tablet (10 mg total) by mouth daily. 04/30/23   Particia Nearing, PA-C  estradiol (VIVELLE-DOT) 0.1 MG/24HR patch Place 1 patch onto the skin 2 (two) times a week. 02/15/21   [provider]  fluticasone (FLONASE) 50 MCG/ACT nasal spray Place 1 spray into both nostrils 2 (two) times daily. Patient not taking: Reported on 06/07/2022 08/08/21   Particia Nearing, PA-C  Multiple Vitamins-Minerals (MULTIVITAMIN ADULT, MINERALS,) TABS Take 1 tablet by mouth daily.    [provider]  predniSONE (DELTASONE) 50 MG tablet Take 1 tab daily with breakfast for 3 days 04/30/23   Particia Nearing, PA-C  progesterone (PROMETRIUM) 100 MG capsule Take 100 mg by mouth daily. 02/15/21   [provider]    Family History Family History  Adopted: Yes  Problem Relation Age of Onset   Diabetes Father    Heart attack Father 52   Hypertension Father    Healthy Mother     Social History Social History   Tobacco Use   Smoking status: Never   Smokeless tobacco: Never  Vaping Use   Vaping status: Never Used  Substance Use Topics   Alcohol use: Yes    Comment: occasional   Drug use: No     Allergies   Augmentin [amoxicillin-pot clavulanate]   Review of Systems Review of Systems PER HPI  Physical Exam Triage Vital Signs ED Triage Vitals  Encounter Vitals Group     BP 04/30/23 1306 (!) 144/88     Systolic BP Percentile --      Diastolic BP Percentile --      Pulse Rate 04/30/23 1306 69     Resp 04/30/23 1306 10     Temp 04/30/23 1306 98.8 F (37.1 C)     Temp Source 04/30/23 1306 Oral     SpO2 04/30/23 1306 98 %      Weight --      Height --      Head Circumference --      Peak Flow --      Pain Score 04/30/23 1305 7     Pain Loc --      Pain Education --      Exclude from Growth Chart --    No data found.  Updated Vital Signs BP (!) 144/88 (BP Location: Right Arm)   Pulse 69   Temp 98.8 F (37.1 C) (Oral)   Resp 10   SpO2 98%   Visual Acuity Right Eye Distance:   Left Eye Distance:   Bilateral Distance:    Right Eye Near:   Left Eye Near:    Bilateral Near:     Physical Exam Vitals and nursing note reviewed.  Constitutional:      Appearance: Normal appearance. She is not ill-appearing.  HENT:     Head: Atraumatic.     Right Ear: Tympanic membrane normal.     Ears:     Comments: Mild left middle ear effusion    Nose: Rhinorrhea present.     Mouth/Throat:     Mouth: Mucous membranes are moist.     Pharynx: Oropharynx is clear.  Eyes:     Extraocular Movements: Extraocular movements intact.     Conjunctiva/sclera: Conjunctivae normal.  Cardiovascular:     Rate and Rhythm: Normal rate.  Pulmonary:     Effort: Pulmonary effort is normal.  Musculoskeletal:        General: Normal range of motion.     Cervical back: Normal range of motion and neck supple.  Skin:    General: Skin is warm and dry.  Neurological:     Mental Status: She is alert and oriented to person, place, and time.  Psychiatric:        Mood and Affect: Mood normal.        Thought Content: Thought content normal.        Judgment: Judgment normal.      UC Treatments / Results  Labs (all labs ordered are listed, but only abnormal results are displayed) Labs Reviewed - No data to display  EKG   Radiology No results found.  Procedures Procedures (including critical care time)  Medications Ordered in UC Medications - No data to display  Initial Impression / Assessment and Plan / UC Course  I have reviewed the triage vital signs and the nursing notes.  Pertinent labs & imaging results that were  available during my care of the patient were reviewed by me and considered in my medical decision making (see chart for details).     Restart allergy regimen with Zyrtec, Flonase and add Astelin, prednisone for eustachian tube dysfunction.  Follow-up with ENT for recheck if not resolving.  Final Clinical Impressions(s) / UC Diagnoses   Final diagnoses:  Acute dysfunction of left eustachian tube  Seasonal allergic rhinitis due to other allergic trigger   Discharge Instructions   None    ED Prescriptions     Medication Sig Dispense Auth. Provider   cetirizine (ZYRTEC ALLERGY) 10 MG tablet Take 1 tablet (10 mg total) by mouth daily. 30 tablet Particia Nearing, [place]   predniSONE (DELTASONE) 50 MG tablet Take 1 tab daily with breakfast for 3 days 3 tablet Particia Nearing, PA-C   azelastine (ASTELIN) 0.1 % nasal spray Place 1 spray into both nostrils 2 (two) times daily. Use in each nostril as directed 30 mL Particia Nearing, PA-C      PDMP not reviewed this encounter.   Particia Nearing, [place] 04/30/23 1423

## 2023-05-22 ENCOUNTER — Other Ambulatory Visit: Payer: Self-pay | Admitting: Family Medicine

## 2023-05-30 ENCOUNTER — Other Ambulatory Visit: Payer: Self-pay | Admitting: Internal Medicine

## 2023-05-30 DIAGNOSIS — R519 Headache, unspecified: Secondary | ICD-10-CM

## 2023-05-30 DIAGNOSIS — Z0000 Encounter for general adult medical examination without abnormal findings: Secondary | ICD-10-CM

## 2023-06-08 ENCOUNTER — Ambulatory Visit
Admission: RE | Admit: 2023-06-08 | Discharge: 2023-06-08 | Disposition: A | Discharge status: Home / Self Care | Source: Ambulatory Visit | Attending: Internal Medicine | Admitting: Internal Medicine

## 2023-06-08 ENCOUNTER — Encounter: Payer: Self-pay | Admitting: Radiology

## 2023-06-08 DIAGNOSIS — R519 Headache, unspecified: Secondary | ICD-10-CM

## 2023-06-08 DIAGNOSIS — Z0000 Encounter for general adult medical examination without abnormal findings: Secondary | ICD-10-CM

## 2023-09-03 NOTE — Progress Notes (Signed)
 Rachel Cabrera is a  58 y.o. female who presents for  CHIEF COMPLAINT Chief Complaint  Patient presents with  . Follow-up  . Hyperlipidemia  . Fatigue  . prediabetes    Subjective: History of Present Illness  Pt in NAD. BP stable. Has HLD not on statin, prediabetes not on meds and fatigue/chronic facial pain on Cymbalta. Weight stable. Facial pain has improved but still fatigued. Sleeping well.  Denies CP or SOB. No palpitations.   Past Medical History:  Diagnosis Date  . History of cancer   . Hyperlipidemia    Patient Active Problem List  Diagnosis  . Pure hypercholesterolemia  . Hx of skin cancer, basal cell  . Prediabetes  . Chronic fatigue    Past Surgical History:  Procedure Laterality Date  . CESAREAN SECTION     2     Current Outpatient Medications:  .  cetirizine  (ZYRTEC ) 10 MG tablet, Take 1 tablet by mouth once daily, Disp: , Rfl:  .  DULoxetine (CYMBALTA) 30 MG DR capsule, TAKE 1 CAPSULE BY MOUTH EVERY DAY, Disp: 90 capsule, Rfl: 0 .  estradiol (DOTTI) patch 0.05 mg/24 hr, 1 patch twice a week, Disp: , Rfl:  .  progesterone (PROMETRIUM) 100 MG capsule, Take 100 mg by mouth once daily, Disp: , Rfl:   Amoxicillin -pot clavulanate  Social History   Socioeconomic History  . Marital status: Married  Tobacco Use  . Smoking status: Never  . Smokeless tobacco: Never  Vaping Use  . Vaping status: Never Used  Substance and Sexual Activity  . Alcohol use: Yes  . Drug use: Never  . Sexual activity: Yes    Partners: Male   Social Drivers of Health   Financial Resource Strain: Low Risk  (05/28/2023)   Overall Financial Resource Strain (CARDIA)   . Difficulty of Paying Living Expenses: Not hard at all  Food Insecurity: No Food Insecurity (05/28/2023)   Hunger Vital Sign   . Worried About Programme Researcher, Broadcasting/film/video in the Last Year: Never true   . Ran Out of Food in the Last Year: Never true  Transportation Needs: No Transportation Needs (05/28/2023)   PRAPARE -  Transportation   . Lack of Transportation (Medical): No   . Lack of Transportation (Non-Medical): No  Housing Stability: Low Risk  (05/28/2023)   Housing Stability Vital Sign   . Unable to Pay for Housing in the Last Year: No   . Number of Times Moved in the Last Year: 0   . Homeless in the Last Year: No    Family History  Adopted: Yes  Problem Relation Name Age of Onset  . No Known Problems Mother    . No Known Problems Father      A comprehensive ROS was negative except for HPI  PE: BP 128/82   Pulse 92   Ht 152.4 cm (5')   Wt 64.4 kg (142 lb)   SpO2 99%   BMI 27.73 kg/m  General: Alert oriented x3  Eyes: Sclera and conjunctiva clear; pupils equal round and reactive to light and accommodation; extraocular movements intact   Nose: Mucosa healthy without drainage or ulceration Oropharynx: No suspicious lesions Neck: No swelling, masses, stiffness, pain, limited movement, carotid pulses normal bilaterally, thyroid  normal size, no masses palpated. No bruits heard. Lungs: Respirations unlabored; clear to auscultation bilaterally Back: No spinal deformity Cardiovascular: Heart regular rate and rhythm without murmurs, gallops, or rubs Abdomen: Soft; non tender; non distended;  no masses or organomegaly Lymph Nodes:  No significant cervical, supraclavicular, or axillary lymphadenopathy noted Musculoskeletal: No active joint inflammation Extremities: Normal, no edema Pulses: Dorsalis pedis palpable and symmetric bilaterally Neurologic: Alert and oriented; speech intact; face symmetrical; moves all extremities well    Appointment on 08/31/2023  Component Date Value Ref Range Status  . Glucose 08/31/2023 109  70 - 110 mg/dL Final  . Sodium 92/81/7974 139  136 - 145 mmol/L Final  . Potassium 08/31/2023 4.5  3.6 - 5.1 mmol/L Final  . Chloride 08/31/2023 106  97 - 109 mmol/L Final  . Carbon Dioxide (CO2) 08/31/2023 27.7  22.0 - 32.0 mmol/L Final  . Urea Nitrogen (BUN) 08/31/2023 20   7 - 25 mg/dL Final  . Creatinine 92/81/7974 0.7  0.6 - 1.1 mg/dL Final  . Glomerular Filtration Rate (eGFR) 08/31/2023 102  >60 mL/min/1.73sq m Final  . Calcium  08/31/2023 9.3  8.7 - 10.3 mg/dL Final  . AST  92/81/7974 27  8 - 39 U/L Final  . ALT  08/31/2023 39 (H)  5 - 38 U/L Final  . Alk Phos (alkaline Phosphatase) 08/31/2023 45  34 - 104 U/L Final  . Albumin 08/31/2023 4.3  3.5 - 4.8 g/dL Final  . Bilirubin, Total 08/31/2023 0.6  0.3 - 1.2 mg/dL Final  . Protein, Total 08/31/2023 6.7  6.1 - 7.9 g/dL Final  . A/G Ratio 92/81/7974 1.8  1.0 - 5.0 gm/dL Final  . Cholesterol, Total 08/31/2023 238 (H)  100 - 200 mg/dL Final  . Triglyceride 92/81/7974 127  35 - 199 mg/dL Final  . HDL (High Density Lipoprotein) Cho* 08/31/2023 55.8  35.0 - 85.0 mg/dL Final  . LDL Calculated 08/31/2023 842 (H)  0 - 130 mg/dL Final  . VLDL Cholesterol 08/31/2023 25  mg/dL Final  . Cholesterol/HDL Ratio 08/31/2023 4.3   Final  . Hemoglobin A1C 08/31/2023 5.7 (H)  4.2 - 5.6 % Final  . Average Blood Glucose (Calc) 08/31/2023 117  mg/dL Final  Appointment on 95/78/7974  Component Date Value Ref Range Status  . WBC (White Blood Cell Count) 06/04/2023 5.9  4.1 - 10.2 10^3/uL Final  . RBC (Red Blood Cell Count) 06/04/2023 4.37  4.04 - 5.48 10^6/uL Final  . Hemoglobin 06/04/2023 13.7  12.0 - 15.0 gm/dL Final  . Hematocrit 95/78/7974 40.3  35.0 - 47.0 % Final  . MCV (Mean Corpuscular Volume) 06/04/2023 92.2  80.0 - 100.0 fl Final  . MCH (Mean Corpuscular Hemoglobin) 06/04/2023 31.4 (H)  27.0 - 31.2 pg Final  . MCHC (Mean Corpuscular Hemoglobin * 06/04/2023 34.0  32.0 - 36.0 gm/dL Final  . Platelet Count 06/04/2023 301  150 - 450 10^3/uL Final  . RDW-CV (Red Cell Distribution Widt* 06/04/2023 11.7  11.6 - 14.8 % Final  . MPV (Mean Platelet Volume) 06/04/2023 10.0  9.4 - 12.4 fl Final  . Neutrophils 06/04/2023 3.32  1.50 - 7.80 10^3/uL Final  . Lymphocytes 06/04/2023 1.98  1.00 - 3.60 10^3/uL Final  . Monocytes  06/04/2023 0.44  0.00 - 1.50 10^3/uL Final  . Eosinophils 06/04/2023 0.08  0.00 - 0.55 10^3/uL Final  . Basophils 06/04/2023 0.03  0.00 - 0.09 10^3/uL Final  . Neutrophil % 06/04/2023 56.6  32.0 - 70.0 % Final  . Lymphocyte % 06/04/2023 33.7  10.0 - 50.0 % Final  . Monocyte % 06/04/2023 7.5  4.0 - 13.0 % Final  . Eosinophil % 06/04/2023 1.4  1.0 - 5.0 % Final  . Basophil% 06/04/2023 0.5  0.0 - 2.0 % Final  .  Immature Granulocyte % 06/04/2023 0.3  <=0.7 % Final  . Immature Granulocyte Count 06/04/2023 0.02  <=0.06 10^3/L Final  . Glucose 06/04/2023 129 (H)  70 - 110 mg/dL Final  . Sodium 95/78/7974 139  136 - 145 mmol/L Final  . Potassium 06/04/2023 4.2  3.6 - 5.1 mmol/L Final  . Chloride 06/04/2023 107  97 - 109 mmol/L Final  . Carbon Dioxide (CO2) 06/04/2023 25.0  22.0 - 32.0 mmol/L Final  . Urea Nitrogen (BUN) 06/04/2023 17  7 - 25 mg/dL Final  . Creatinine 95/78/7974 0.7  0.6 - 1.1 mg/dL Final  . Glomerular Filtration Rate (eGFR) 06/04/2023 102  >60 mL/min/1.73sq m Final  . Calcium  06/04/2023 9.2  8.7 - 10.3 mg/dL Final  . AST  95/78/7974 26  8 - 39 U/L Final  . ALT  06/04/2023 35  5 - 38 U/L Final  . Alk Phos (alkaline Phosphatase) 06/04/2023 35  34 - 104 U/L Final  . Albumin 06/04/2023 4.5  3.5 - 4.8 g/dL Final  . Bilirubin, Total 06/04/2023 0.6  0.3 - 1.2 mg/dL Final  . Protein, Total 06/04/2023 6.8  6.1 - 7.9 g/dL Final  . A/G Ratio 95/78/7974 2.0  1.0 - 5.0 gm/dL Final  . Cholesterol, Total 06/04/2023 240 (H)  100 - 200 mg/dL Final  . Triglyceride 95/78/7974 88  35 - 199 mg/dL Final  . HDL (High Density Lipoprotein) Cho* 06/04/2023 57.2  35.0 - 85.0 mg/dL Final  . LDL Calculated 06/04/2023 834 (H)  0 - 130 mg/dL Final  . VLDL Cholesterol 06/04/2023 18  mg/dL Final  . Cholesterol/HDL Ratio 06/04/2023 4.2   Final  . Thyroid  Stimulating Hormone (TSH) 06/04/2023 2.005  0.450-5.330 uIU/ml uIU/mL Final  . Color 06/04/2023 Yellow  Colorless, Straw, Light Yellow, Yellow, Dark Yellow  Final  . Clarity 06/04/2023 Clear  Clear Final  . Specific Gravity 06/04/2023 1.022  1.005 - 1.030 Final  . pH, Urine 06/04/2023 5.5  5.0 - 8.0 Final  . Protein, Urinalysis 06/04/2023 Negative  Negative mg/dL Final  . Glucose, Urinalysis 06/04/2023 Negative  Negative mg/dL Final  . Ketones, Urinalysis 06/04/2023 Negative  Negative mg/dL Final  . Blood, Urinalysis 06/04/2023 2+ (!)  Negative Final  . Nitrite, Urinalysis 06/04/2023 Negative  Negative Final  . Leukocyte Esterase, Urinalysis 06/04/2023 Negative  Negative Final  . Bilirubin, Urinalysis 06/04/2023 Negative  Negative Final  . Urobilinogen, Urinalysis 06/04/2023 0.2  0.2 - 1.0 mg/dL Final  . WBC, UA 95/78/7974 2  <=5 /hpf Final  . Red Blood Cells, Urinalysis 06/04/2023 1  <=3 /hpf Final  . Bacteria, Urinalysis 06/04/2023 0-5  0 - 5 /hpf Final  . Squamous Epithelial Cells, Urinaly* 06/04/2023 7  /hpf Final   DIAGNOSIS: Pure hypercholesterolemia  (primary encounter diagnosis)  Prediabetes  Chronic fatigue   PLAN: HLD- begin Crestor, labs 3 mo Prediabetes- diet/exercise/water, labs 3 mo Fatigue- increase Cymbalta to 60mg  RTC 3 mo, sooner if needed     Attestation Statement:   I personally performed the service. (TP)  Reyes JONETTA Costa, MD, MD

## 2023-11-12 NOTE — Progress Notes (Signed)
 History of Present Illness:   Rachel Cabrera is a 58 y.o. female here for   Verbally consented to the use of AI for note-taking.   Chief Complaint  Patient presents with  . Urinary Symptoms      History of Present Illness Rachel Cabrera is a 58 year old female who presents with symptoms suggestive of a urinary tract infection or kidney infection.  She initially noticed symptoms last week, including cloudy urine and dysuria. She contacted [facility] and was prescribed an antibiotic, possibly Macrobid , which she began taking on Saturday. By Sunday, her symptoms returned and worsened, becoming unbearable by Monday morning. She also noted hematuria and described the urine as having an 'ammonia-like' odor.  Despite completing the antibiotic course, her symptoms persisted and worsened. She denies fever but reports variable hematuria, initially being minimal and then significant, before easing off. At the time of providing a urine sample, she still experienced dysuria but did not visibly notice blood.  In addition to her urinary symptoms, she mentions a fall that occurred a week ago on Monday while hanging Halloween decorations, resulting in a contusion on her back. She describes the bruise as very dark, black, and green, and notes tenderness in the area but no significant pain. The fall took her breath away at the time, and she has not been doing much for the back pain since then.  She is allergic to amoxicillin  and has not experienced recurrent UTIs in the past. No heart issues or other significant medical conditions. She has not been engaging in strenuous exercise.   Past Medical History:   Past Medical History:  Diagnosis Date  . History of cancer   . Hyperlipidemia     Past Surgical History:   Past Surgical History:  Procedure Laterality Date  . CESAREAN SECTION     2    Allergies:   Allergies  Allergen Reactions  . Amoxicillin -Pot Clavulanate Hives    Current Medications:    Prior to Admission medications  Medication Sig Taking? Last Dose  cetirizine  (ZYRTEC ) 10 MG tablet Take 1 tablet by mouth once daily Yes Taking  DULoxetine (CYMBALTA) 60 MG DR capsule Take 1 capsule (60 mg total) by mouth once daily Yes Taking  estradiol (DOTTI) patch 0.05 mg/24 hr 1 patch twice a week Yes Taking  progesterone (PROMETRIUM) 100 MG capsule Take 100 mg by mouth once daily Yes Taking  rosuvastatin (CRESTOR) 10 MG tablet Take 1 tablet (10 mg total) by mouth once daily Yes Taking  ciprofloxacin HCl (CIPRO) 500 MG tablet Take 1 tablet (500 mg total) by mouth 2 (two) times daily for 7 days      Family History:   Family History  Adopted: Yes  Problem Relation Name Age of Onset  . No Known Problems Mother    . No Known Problems Father      Social History:   Social History   Socioeconomic History  . Marital status: Married  Tobacco Use  . Smoking status: Never  . Smokeless tobacco: Never  Vaping Use  . Vaping status: Never Used  Substance and Sexual Activity  . Alcohol use: Yes  . Drug use: Never  . Sexual activity: Yes    Partners: Male   Social Drivers of Health   Financial Resource Strain: Low Risk  (05/28/2023)   Overall Financial Resource Strain (CARDIA)   . Difficulty of Paying Living Expenses: Not hard at all  Food Insecurity: No Food Insecurity (05/28/2023)   Hunger Vital Sign   .  Worried About Programme Researcher, Broadcasting/film/video in the Last Year: Never true   . Ran Out of Food in the Last Year: Never true  Transportation Needs: No Transportation Needs (05/28/2023)   PRAPARE - Transportation   . Lack of Transportation (Medical): No   . Lack of Transportation (Non-Medical): No  Housing Stability: Low Risk  (05/28/2023)   Housing Stability Vital Sign   . Unable to Pay for Housing in the Last Year: No   . Number of Times Moved in the Last Year: 0   . Homeless in the Last Year: No    Review of Systems:   A 10 point review of systems is negative, except for the  pertinent positives and negatives detailed in the HPI.  Vitals:   Vitals:   11/12/23 1120  BP: 124/76  Pulse: 89  SpO2: 97%  Weight: 64.4 kg (142 lb)     Body mass index is 27.73 kg/m.  Physical Exam:   Physical Exam Vitals and nursing note reviewed.  Constitutional:      General: She is not in acute distress.    Appearance: Normal appearance. She is not ill-appearing, toxic-appearing or diaphoretic.  HENT:     Head: Normocephalic and atraumatic.     Right Ear: External ear normal.     Left Ear: External ear normal.  Eyes:     Conjunctiva/sclera: Conjunctivae normal.  Cardiovascular:     Rate and Rhythm: Normal rate and regular rhythm.     Pulses: Normal pulses.     Heart sounds: Normal heart sounds.  Pulmonary:     Effort: Pulmonary effort is normal.     Breath sounds: Normal breath sounds.  Abdominal:     Tenderness: There is no abdominal tenderness. There is no right CVA tenderness or left CVA tenderness.  Neurological:     Mental Status: She is alert.     Assessment and Plan:  No results found for this visit on 11/12/23.   Assessment & Plan Urinary tract infection with hematuria Recurrent UTI with hematuria, severe dysuria, and ammonia-like odor. Previous Macrobid  treatment ineffective. Differential includes possible pyelonephritis or resistant strain. Consider nephrolithiasis if symptoms persist.  Pyelonephritis less likely due to lack of CVA tenderness or fevers - Prescribe ciprofloxacin for 7 days. - Order urine culture for antibiotic susceptibility. - Advise to report increased hematuria or migratory pain. - Instruct to avoid strenuous exercise due to tendon rupture risk. - Encourage increased water intake.  Back contusion from recent fall Contusion with large ecchymosis and tenderness. No rib fracture. Pain localized, not urinary-related. - Recommend heating pad for muscle tension and ecchymosis resolution. - Advise 1000 mg acetaminophen  every 8 hours  for pain. - Instruct to avoid strenuous activities.  Disposition: Follow-up as needed  Patient Instructions  Will add a culture onto your labs today and make sure the antibiotic we sent in will be helpful.  I would start taking it Tylenol  1000 mg every 8 hours for pain and the spot on your back to use a heating pad and just rested.  If you are not feeling better after using this antibiotic in the next few days please let us  know.   This note has been created using automated tools and reviewed for accuracy by provider.  Patient received an After Visit Summary    Attestation Statement:   I personally performed the service, non-incident to. (WP)   MASON MCCLELLAND MINOR, PA

## 2023-12-05 NOTE — Progress Notes (Signed)
 Rachel Cabrera is a  58 y.o. female who presents for  CHIEF COMPLAINT Chief Complaint  Patient presents with  . Follow-up  . Hyperlipidemia  . Fatigue  . prediabetes    Subjective: History of Present Illness  Pt in NAD. BP stable. Has HLD on statin, prediabetes not on meds and chronic fatigue on SNRI. Sleeping OK. No fever or HA's. Denies CP or SOB. No palpitations. Fatigue stable. No change in bowels or bladder.    Past Medical History:  Diagnosis Date  . History of cancer   . Hyperlipidemia    Patient Active Problem List  Diagnosis  . Pure hypercholesterolemia  . Hx of skin cancer, basal cell  . Prediabetes  . Chronic fatigue    Past Surgical History:  Procedure Laterality Date  . CESAREAN SECTION     2     Current Outpatient Medications:  .  cetirizine  (ZYRTEC ) 10 MG tablet, Take 1 tablet by mouth once daily, Disp: , Rfl:  .  DULoxetine (CYMBALTA) 60 MG DR capsule, Take 1 capsule (60 mg total) by mouth once daily, Disp: 90 capsule, Rfl: 3 .  estradiol (DOTTI) patch 0.05 mg/24 hr, 1 patch twice a week, Disp: , Rfl:  .  progesterone (PROMETRIUM) 100 MG capsule, Take 100 mg by mouth once daily, Disp: , Rfl:  .  rosuvastatin (CRESTOR) 10 MG tablet, Take 1 tablet (10 mg total) by mouth once daily, Disp: 90 tablet, Rfl: 3  Amoxicillin -pot clavulanate  Social History   Socioeconomic History  . Marital status: Married  Tobacco Use  . Smoking status: Never  . Smokeless tobacco: Never  Vaping Use  . Vaping status: Never Used  Substance and Sexual Activity  . Alcohol use: Yes  . Drug use: Never  . Sexual activity: Yes    Partners: Male   Social Drivers of Health   Financial Resource Strain: Low Risk  (05/28/2023)   Overall Financial Resource Strain (CARDIA)   . Difficulty of Paying Living Expenses: Not hard at all  Food Insecurity: No Food Insecurity (05/28/2023)   Hunger Vital Sign   . Worried About Programme Researcher, Broadcasting/film/video in the Last Year: Never true   . Ran Out of  Food in the Last Year: Never true  Transportation Needs: No Transportation Needs (05/28/2023)   PRAPARE - Transportation   . Lack of Transportation (Medical): No   . Lack of Transportation (Non-Medical): No  Housing Stability: Low Risk  (05/28/2023)   Housing Stability Vital Sign   . Unable to Pay for Housing in the Last Year: No   . Number of Times Moved in the Last Year: 0   . Homeless in the Last Year: No    Family History  Adopted: Yes  Problem Relation Name Age of Onset  . No Known Problems Mother    . No Known Problems Father      A comprehensive ROS was negative except for HPI  PE: BP 118/72   Pulse 87   Ht 152.4 cm (5')   Wt 65.8 kg (145 lb)   SpO2 99%   BMI 28.32 kg/m  General: Alert oriented x3  Eyes: Sclera and conjunctiva clear; pupils equal round and reactive to light and accommodation; extraocular movements intact.   Nose: Mucosa healthy without drainage or ulceration Oropharynx: No suspicious lesions Neck: No swelling, masses, stiffness, pain, limited movement, carotid pulses normal bilaterally, thyroid  normal size, no masses palpated. No bruits heard. Lungs: Respirations unlabored; clear to auscultation bilaterally Back: No  spinal deformity Cardiovascular: Heart regular rate and rhythm without murmurs, gallops, or rubs Abdomen: Soft; non tender; non distended;  no masses or organomegaly Lymph Nodes: No significant cervical, supraclavicular, or axillary lymphadenopathy noted Musculoskeletal: No active joint inflammation Extremities: Normal, no edema Pulses: Dorsalis pedis palpable and symmetric bilaterally Neurologic: Alert and oriented; speech intact; face symmetrical; moves all extremities well    Appointment on 11/28/2023  Component Date Value Ref Range Status  . Glucose 11/28/2023 105  70 - 110 mg/dL Final  . Sodium 89/84/7974 138  136 - 145 mmol/L Final  . Potassium 11/28/2023 4.5  3.6 - 5.1 mmol/L Final  . Chloride 11/28/2023 103  97 - 109 mmol/L  Final  . Carbon Dioxide (CO2) 11/28/2023 29.3  22.0 - 32.0 mmol/L Final  . Urea Nitrogen (BUN) 11/28/2023 15  7 - 25 mg/dL Final  . Creatinine 89/84/7974 0.8  0.6 - 1.1 mg/dL Final  . Glomerular Filtration Rate (eGFR) 11/28/2023 86  >60 mL/min/1.73sq m Final  . Calcium  11/28/2023 9.6  8.7 - 10.3 mg/dL Final  . AST  89/84/7974 20  8 - 39 U/L Final  . ALT  11/28/2023 31  5 - 38 U/L Final  . Alk Phos (alkaline Phosphatase) 11/28/2023 51  34 - 104 U/L Final  . Albumin 11/28/2023 4.4  3.5 - 4.8 g/dL Final  . Bilirubin, Total 11/28/2023 0.6  0.3 - 1.2 mg/dL Final  . Protein, Total 11/28/2023 6.7  6.1 - 7.9 g/dL Final  . A/G Ratio 89/84/7974 1.9  1.0 - 5.0 gm/dL Final  . Cholesterol, Total 11/28/2023 157  100 - 200 mg/dL Final  . Triglyceride 89/84/7974 96  35 - 199 mg/dL Final  . HDL (High Density Lipoprotein) Cho* 11/28/2023 58.6  35.0 - 85.0 mg/dL Final  . LDL Calculated 11/28/2023 79  0 - 130 mg/dL Final  . VLDL Cholesterol 11/28/2023 19  mg/dL Final  . Cholesterol/HDL Ratio 11/28/2023 2.7   Final  . Hemoglobin A1C 11/28/2023 5.7 (H)  4.2 - 5.6 % Final  . Average Blood Glucose (Calc) 11/28/2023 117  mg/dL Final  Appointment on 90/70/7974  Component Date Value Ref Range Status  . Color 11/12/2023 Light Yellow  Colorless, Straw, Light Yellow, Yellow, Dark Yellow Final  . Clarity 11/12/2023 Cloudy (!)  Clear Final  . Specific Gravity 11/12/2023 1.005  1.005 - 1.030 Final  . pH, Urine 11/12/2023 6.5  5.0 - 8.0 Final  . Protein, Urinalysis 11/12/2023 Negative  Negative mg/dL Final  . Glucose, Urinalysis 11/12/2023 Negative  Negative mg/dL Final  . Ketones, Urinalysis 11/12/2023 Negative  Negative mg/dL Final  . Blood, Urinalysis 11/12/2023 3+ (!)  Negative Final  . Nitrite, Urinalysis 11/12/2023 Negative  Negative Final  . Leukocyte Esterase, Urinalysis 11/12/2023 3+ (!)  Negative Final  . Bilirubin, Urinalysis 11/12/2023 Negative  Negative Final  . Urobilinogen, Urinalysis 11/12/2023 0.2   0.2 - 1.0 mg/dL Final  . WBC, UA 90/70/7974 >182 (H)  <=5 /hpf Final  . Red Blood Cells, Urinalysis 11/12/2023 4 (H)  <=3 /hpf Final  . Bacteria, Urinalysis 11/12/2023 5-50 (!)  0 - 5 /hpf Final  . Squamous Epithelial Cells, Urinaly* 11/12/2023 2  /hpf Final  . Urine Culture, Routine - [facility] 11/12/2023 Final report (!)   Final  . Result 1 - [facility] 11/12/2023 Serratia marcescens (!)   Final  . Antimicrobial Susceptibility - Lab* 11/12/2023 Comment   Final  Appointment on 08/31/2023  Component Date Value Ref Range Status  . Glucose 08/31/2023 109  70 - 110 mg/dL Final  . Sodium 92/81/7974 139  136 - 145 mmol/L Final  . Potassium 08/31/2023 4.5  3.6 - 5.1 mmol/L Final  . Chloride 08/31/2023 106  97 - 109 mmol/L Final  . Carbon Dioxide (CO2) 08/31/2023 27.7  22.0 - 32.0 mmol/L Final  . Urea Nitrogen (BUN) 08/31/2023 20  7 - 25 mg/dL Final  . Creatinine 92/81/7974 0.7  0.6 - 1.1 mg/dL Final  . Glomerular Filtration Rate (eGFR) 08/31/2023 102  >60 mL/min/1.73sq m Final  . Calcium  08/31/2023 9.3  8.7 - 10.3 mg/dL Final  . AST  92/81/7974 27  8 - 39 U/L Final  . ALT  08/31/2023 39 (H)  5 - 38 U/L Final  . Alk Phos (alkaline Phosphatase) 08/31/2023 45  34 - 104 U/L Final  . Albumin 08/31/2023 4.3  3.5 - 4.8 g/dL Final  . Bilirubin, Total 08/31/2023 0.6  0.3 - 1.2 mg/dL Final  . Protein, Total 08/31/2023 6.7  6.1 - 7.9 g/dL Final  . A/G Ratio 92/81/7974 1.8  1.0 - 5.0 gm/dL Final  . Cholesterol, Total 08/31/2023 238 (H)  100 - 200 mg/dL Final  . Triglyceride 92/81/7974 127  35 - 199 mg/dL Final  . HDL (High Density Lipoprotein) Cho* 08/31/2023 55.8  35.0 - 85.0 mg/dL Final  . LDL Calculated 08/31/2023 842 (H)  0 - 130 mg/dL Final  . VLDL Cholesterol 08/31/2023 25  mg/dL Final  . Cholesterol/HDL Ratio 08/31/2023 4.3   Final  . Hemoglobin A1C 08/31/2023 5.7 (H)  4.2 - 5.6 % Final  . Average Blood Glucose (Calc) 08/31/2023 117  mg/dL Final  Appointment on 95/78/7974  Component Date Value Ref  Range Status  . WBC (White Blood Cell Count) 06/04/2023 5.9  4.1 - 10.2 10^3/uL Final  . RBC (Red Blood Cell Count) 06/04/2023 4.37  4.04 - 5.48 10^6/uL Final  . Hemoglobin 06/04/2023 13.7  12.0 - 15.0 gm/dL Final  . Hematocrit 95/78/7974 40.3  35.0 - 47.0 % Final  . MCV (Mean Corpuscular Volume) 06/04/2023 92.2  80.0 - 100.0 fl Final  . MCH (Mean Corpuscular Hemoglobin) 06/04/2023 31.4 (H)  27.0 - 31.2 pg Final  . MCHC (Mean Corpuscular Hemoglobin * 06/04/2023 34.0  32.0 - 36.0 gm/dL Final  . Platelet Count 06/04/2023 301  150 - 450 10^3/uL Final  . RDW-CV (Red Cell Distribution Widt* 06/04/2023 11.7  11.6 - 14.8 % Final  . MPV (Mean Platelet Volume) 06/04/2023 10.0  9.4 - 12.4 fl Final  . Neutrophils 06/04/2023 3.32  1.50 - 7.80 10^3/uL Final  . Lymphocytes 06/04/2023 1.98  1.00 - 3.60 10^3/uL Final  . Monocytes 06/04/2023 0.44  0.00 - 1.50 10^3/uL Final  . Eosinophils 06/04/2023 0.08  0.00 - 0.55 10^3/uL Final  . Basophils 06/04/2023 0.03  0.00 - 0.09 10^3/uL Final  . Neutrophil % 06/04/2023 56.6  32.0 - 70.0 % Final  . Lymphocyte % 06/04/2023 33.7  10.0 - 50.0 % Final  . Monocyte % 06/04/2023 7.5  4.0 - 13.0 % Final  . Eosinophil % 06/04/2023 1.4  1.0 - 5.0 % Final  . Basophil% 06/04/2023 0.5  0.0 - 2.0 % Final  . Immature Granulocyte % 06/04/2023 0.3  <=0.7 % Final  . Immature Granulocyte Count 06/04/2023 0.02  <=0.06 10^3/L Final  . Glucose 06/04/2023 129 (H)  70 - 110 mg/dL Final  . Sodium 95/78/7974 139  136 - 145 mmol/L Final  . Potassium 06/04/2023 4.2  3.6 - 5.1 mmol/L Final  .  Chloride 06/04/2023 107  97 - 109 mmol/L Final  . Carbon Dioxide (CO2) 06/04/2023 25.0  22.0 - 32.0 mmol/L Final  . Urea Nitrogen (BUN) 06/04/2023 17  7 - 25 mg/dL Final  . Creatinine 95/78/7974 0.7  0.6 - 1.1 mg/dL Final  . Glomerular Filtration Rate (eGFR) 06/04/2023 102  >60 mL/min/1.73sq m Final  . Calcium  06/04/2023 9.2  8.7 - 10.3 mg/dL Final  . AST  95/78/7974 26  8 - 39 U/L Final  . ALT   06/04/2023 35  5 - 38 U/L Final  . Alk Phos (alkaline Phosphatase) 06/04/2023 35  34 - 104 U/L Final  . Albumin 06/04/2023 4.5  3.5 - 4.8 g/dL Final  . Bilirubin, Total 06/04/2023 0.6  0.3 - 1.2 mg/dL Final  . Protein, Total 06/04/2023 6.8  6.1 - 7.9 g/dL Final  . A/G Ratio 95/78/7974 2.0  1.0 - 5.0 gm/dL Final  . Cholesterol, Total 06/04/2023 240 (H)  100 - 200 mg/dL Final  . Triglyceride 95/78/7974 88  35 - 199 mg/dL Final  . HDL (High Density Lipoprotein) Cho* 06/04/2023 57.2  35.0 - 85.0 mg/dL Final  . LDL Calculated 06/04/2023 834 (H)  0 - 130 mg/dL Final  . VLDL Cholesterol 06/04/2023 18  mg/dL Final  . Cholesterol/HDL Ratio 06/04/2023 4.2   Final  . Thyroid  Stimulating Hormone (TSH) 06/04/2023 2.005  0.450-5.330 uIU/ml uIU/mL Final  . Color 06/04/2023 Yellow  Colorless, Straw, Light Yellow, Yellow, Dark Yellow Final  . Clarity 06/04/2023 Clear  Clear Final  . Specific Gravity 06/04/2023 1.022  1.005 - 1.030 Final  . pH, Urine 06/04/2023 5.5  5.0 - 8.0 Final  . Protein, Urinalysis 06/04/2023 Negative  Negative mg/dL Final  . Glucose, Urinalysis 06/04/2023 Negative  Negative mg/dL Final  . Ketones, Urinalysis 06/04/2023 Negative  Negative mg/dL Final  . Blood, Urinalysis 06/04/2023 2+ (!)  Negative Final  . Nitrite, Urinalysis 06/04/2023 Negative  Negative Final  . Leukocyte Esterase, Urinalysis 06/04/2023 Negative  Negative Final  . Bilirubin, Urinalysis 06/04/2023 Negative  Negative Final  . Urobilinogen, Urinalysis 06/04/2023 0.2  0.2 - 1.0 mg/dL Final  . WBC, UA 95/78/7974 2  <=5 /hpf Final  . Red Blood Cells, Urinalysis 06/04/2023 1  <=3 /hpf Final  . Bacteria, Urinalysis 06/04/2023 0-5  0 - 5 /hpf Final  . Squamous Epithelial Cells, Urinaly* 06/04/2023 7  /hpf Final   DIAGNOSIS: Pure hypercholesterolemia  (primary encounter diagnosis)  Prediabetes  Chronic fatigue   PLAN: Fatigue- stable, meds refilled Prediabetes- diet/exercise/water, labs 6 mo HLD-  diet/exercise/statin, labs 6 mo RTC 6 mo, sooner I fneeded     Attestation Statement:   I personally performed the service. (TP)  Reyes JONETTA Costa, MD, MD

## 2023-12-11 ENCOUNTER — Emergency Department (HOSPITAL_COMMUNITY)
Admission: EM | Admit: 2023-12-11 | Discharge: 2023-12-11 | Disposition: A | Discharge status: Home / Self Care | Attending: Emergency Medicine | Admitting: Emergency Medicine

## 2023-12-11 ENCOUNTER — Emergency Department (HOSPITAL_COMMUNITY)

## 2023-12-11 ENCOUNTER — Other Ambulatory Visit: Payer: Self-pay

## 2023-12-11 ENCOUNTER — Encounter (HOSPITAL_COMMUNITY): Payer: Self-pay

## 2023-12-11 DIAGNOSIS — R55 Syncope and collapse: Secondary | ICD-10-CM | POA: Insufficient documentation

## 2023-12-11 DIAGNOSIS — Y900 Blood alcohol level of less than 20 mg/100 ml: Secondary | ICD-10-CM | POA: Insufficient documentation

## 2023-12-11 DIAGNOSIS — R519 Headache, unspecified: Secondary | ICD-10-CM | POA: Diagnosis present

## 2023-12-11 LAB — CBC WITH DIFFERENTIAL/PLATELET
Abs Immature Granulocytes: 0.03 K/uL (ref 0.00–0.07)
Basophils Absolute: 0 K/uL (ref 0.0–0.1)
Basophils Relative: 1 %
Eosinophils Absolute: 0 K/uL (ref 0.0–0.5)
Eosinophils Relative: 1 %
HCT: 38.4 % (ref 36.0–46.0)
Hemoglobin: 12.8 g/dL (ref 12.0–15.0)
Immature Granulocytes: 0 %
Lymphocytes Relative: 3 %
Lymphs Abs: 0.2 K/uL — ABNORMAL LOW (ref 0.7–4.0)
MCH: 31.2 pg (ref 26.0–34.0)
MCHC: 33.3 g/dL (ref 30.0–36.0)
MCV: 93.7 fL (ref 80.0–100.0)
Monocytes Absolute: 0.7 K/uL (ref 0.1–1.0)
Monocytes Relative: 8 %
Neutro Abs: 7.5 K/uL (ref 1.7–7.7)
Neutrophils Relative %: 87 %
Platelets: 251 K/uL (ref 150–400)
RBC: 4.1 MIL/uL (ref 3.87–5.11)
RDW: 11.9 % (ref 11.5–15.5)
WBC: 8.6 K/uL (ref 4.0–10.5)
nRBC: 0 % (ref 0.0–0.2)

## 2023-12-11 LAB — URINALYSIS, ROUTINE W REFLEX MICROSCOPIC
Bilirubin Urine: NEGATIVE
Glucose, UA: NEGATIVE mg/dL
Hgb urine dipstick: NEGATIVE
Ketones, ur: NEGATIVE mg/dL
Leukocytes,Ua: NEGATIVE
Nitrite: NEGATIVE
Protein, ur: NEGATIVE mg/dL
Specific Gravity, Urine: 1.028 (ref 1.005–1.030)
pH: 7 (ref 5.0–8.0)

## 2023-12-11 LAB — COMPREHENSIVE METABOLIC PANEL WITH GFR
ALT: 27 U/L (ref 0–44)
AST: 20 U/L (ref 15–41)
Albumin: 3.7 g/dL (ref 3.5–5.0)
Alkaline Phosphatase: 43 U/L (ref 38–126)
Anion gap: 10 (ref 5–15)
BUN: 11 mg/dL (ref 6–20)
CO2: 21 mmol/L — ABNORMAL LOW (ref 22–32)
Calcium: 8.5 mg/dL — ABNORMAL LOW (ref 8.9–10.3)
Chloride: 105 mmol/L (ref 98–111)
Creatinine, Ser: 0.73 mg/dL (ref 0.44–1.00)
GFR, Estimated: >60 mL/min (ref >60)
Glucose, Bld: 123 mg/dL — ABNORMAL HIGH (ref 70–99)
Potassium: 3.9 mmol/L (ref 3.5–5.1)
Sodium: 136 mmol/L (ref 135–145)
Total Bilirubin: 0.4 mg/dL (ref 0.0–1.2)
Total Protein: 6.3 g/dL — ABNORMAL LOW (ref 6.5–8.1)

## 2023-12-11 LAB — CBG MONITORING, ED: Glucose-Capillary: 118 mg/dL — ABNORMAL HIGH (ref 70–99)

## 2023-12-11 LAB — TROPONIN I (HIGH SENSITIVITY)
Troponin I (High Sensitivity): <2 ng/L (ref <18)
Troponin I (High Sensitivity): <2 ng/L (ref <18)

## 2023-12-11 LAB — RAPID URINE DRUG SCREEN, HOSP PERFORMED
Amphetamines: NOT DETECTED
Barbiturates: NOT DETECTED
Benzodiazepines: NOT DETECTED
Cocaine: NOT DETECTED
Opiates: NOT DETECTED
Tetrahydrocannabinol: NOT DETECTED

## 2023-12-11 LAB — ETHANOL: Alcohol, Ethyl (B): <15 mg/dL (ref <15)

## 2023-12-11 MED ORDER — SODIUM CHLORIDE 0.9 % IV BOLUS
500.0000 mL | Freq: Once | INTRAVENOUS | Status: AC
  Administered 2023-12-11: 500 mL via INTRAVENOUS

## 2023-12-11 MED ORDER — ACETAMINOPHEN 500 MG PO TABS
1000.0000 mg | ORAL_TABLET | Freq: Once | ORAL | Status: AC
  Administered 2023-12-11: 1000 mg via ORAL
  Filled 2023-12-11: qty 2

## 2023-12-11 MED ORDER — DIPHENHYDRAMINE HCL 50 MG/ML IJ SOLN
25.0000 mg | Freq: Once | INTRAMUSCULAR | Status: AC
  Administered 2023-12-11: 25 mg via INTRAVENOUS
  Filled 2023-12-11: qty 1

## 2023-12-11 MED ORDER — IOHEXOL 350 MG/ML SOLN
75.0000 mL | Freq: Once | INTRAVENOUS | Status: AC | PRN
  Administered 2023-12-11: 75 mL via INTRAVENOUS

## 2023-12-11 MED ORDER — METOCLOPRAMIDE HCL 5 MG/ML IJ SOLN
10.0000 mg | Freq: Once | INTRAMUSCULAR | Status: AC
  Administered 2023-12-11: 10 mg via INTRAVENOUS
  Filled 2023-12-11: qty 2

## 2023-12-11 NOTE — Discharge Instructions (Signed)
 Return for any problem.  ?

## 2023-12-11 NOTE — ED Notes (Signed)
 PT WAS ABLE TO AMBULATE INDEPENDENTLY AROUND THE THE GREEN POD WITH GOOD BALANCE AND STEADY VITALS.  PT DID NOT REPORT ANY COMPLAINTS WHILE WALKING.

## 2023-12-11 NOTE — ED Notes (Signed)
 PT discharged to home. PT ambulated with an even and steady gate. Breathing is even and unlabored.  PT educated on follow up pt will follow up as directed.

## 2023-12-11 NOTE — ED Triage Notes (Signed)
 PT to etc via [facility] with co a syncope episode around 0700 AM when she went to the bathroom.PT says before she fell it felt like something was clamping down on her head and she felt fuzzy.  PT says she also felt nauseated.  PT remembers falling in the bathroom but woke up beside her bed.  She is unsure how PT did wake up around 0200 AM with a headache that did not feel like the usual migraines she has. PT denies any history of heart problems.

## 2023-12-11 NOTE — ED Notes (Signed)
 Capillary blood glucose 118.

## 2023-12-11 NOTE — ED Provider Notes (Signed)
 [facility] Provider Note   CSN: [id number] Arrival date & time: 12/11/23  0900     Patient presents with: Loss of Consciousness and Migraine   Rachel Cabrera is a 58 y.o. female.   58 year old female with prior medical history as detailed below presents for evaluation.  Patient reports that she was in her normal state of health until approximately 2 AM.  She reports having a headache that began then.  The headache apparently persisted to the next several hours.  Around 7 AM she went to the bathroom.  While in the bathroom she reports that headache became much worse.  She believes that she passed out at that point.  Subsequently she was able to crawl to her bedside.  She remained at the bedside on the floor until she can get her husband to call EMS.  She think she was on the floor for about an hour.  She denies focal weakness.  She reports nausea but no vomiting.  She denies fever.  She denies injury from possible syncope.  She denies visual change.  She denies speech change.  She denies chest pain or shortness of breath.  She denies recent illness or fever.  She denies history of migraines.  She reports headaches in the past but never  quite like this.  The history is provided by the patient and medical records.       Prior to Admission medications   Medication Sig Start Date End Date Taking? Authorizing Provider  azelastine  (ASTELIN ) 0.1 % nasal spray Place 1 spray into both nostrils 2 (two) times daily. Use in each nostril as directed 04/30/23   Stuart Vernell Norris, PA-C  carbamide peroxide (DEBROX) 6.5 % OTIC solution Place 5 drops into both ears 2 (two) times daily. Patient not taking: Reported on 06/07/2022 08/08/21   Stuart Vernell Norris, PA-C  cetirizine  (ZYRTEC  ALLERGY) 10 MG tablet Take 1 tablet (10 mg total) by mouth daily. 04/30/23   Stuart Vernell Norris, PA-C  estradiol (VIVELLE-DOT) 0.1 MG/24HR patch Place 1 patch onto the  skin 2 (two) times a week. 02/15/21   [provider]  fluticasone  (FLONASE ) 50 MCG/ACT nasal spray Place 1 spray into both nostrils 2 (two) times daily. Patient not taking: Reported on 06/07/2022 08/08/21   Stuart Vernell Norris, PA-C  Multiple Vitamins-Minerals (MULTIVITAMIN ADULT, MINERALS,) TABS Take 1 tablet by mouth daily.    [provider]  predniSONE  (DELTASONE ) 50 MG tablet Take 1 tab daily with breakfast for 3 days 04/30/23   Stuart Vernell Norris, PA-C  progesterone (PROMETRIUM) 100 MG capsule Take 100 mg by mouth daily. 02/15/21   [provider]    Allergies: Augmentin  [amoxicillin -pot clavulanate]    Review of Systems  All other systems reviewed and are negative.   Updated Vital Signs BP 113/74   Temp 98.3 F (36.8 C) (Oral)   Resp 16   Ht 5' (1.524 m)   Wt 63.5 kg   LMP 12/13/2021 (Approximate)   BMI 27.34 kg/m   Physical Exam Vitals and nursing note reviewed.  Constitutional:      General: She is not in acute distress.    Appearance: She is well-developed.  HENT:     Head: Normocephalic and atraumatic.  Eyes:     Conjunctiva/sclera: Conjunctivae normal.  Cardiovascular:     Rate and Rhythm: Normal rate and regular rhythm.     Heart sounds: No murmur heard. Pulmonary:     Effort: Pulmonary  effort is normal. No respiratory distress.     Breath sounds: Normal breath sounds.  Abdominal:     Palpations: Abdomen is soft.     Tenderness: There is no abdominal tenderness.  Musculoskeletal:        General: No swelling.     Cervical back: Neck supple.  Skin:    General: Skin is warm and dry.     Capillary Refill: Capillary refill takes less than 2 seconds.  Neurological:     General: No focal deficit present.     Mental Status: She is alert and oriented to person, place, and time. Mental status is at baseline.     Cranial Nerves: No cranial nerve deficit.     Sensory: No sensory deficit.     Motor: No weakness.  Psychiatric:         Mood and Affect: Mood normal.     (all labs ordered are listed, but only abnormal results are displayed) Labs Reviewed  CBC WITH DIFFERENTIAL/PLATELET  COMPREHENSIVE METABOLIC PANEL WITH GFR  URINALYSIS, ROUTINE W REFLEX MICROSCOPIC  ETHANOL  RAPID URINE DRUG SCREEN, HOSP PERFORMED  CBG MONITORING, ED  TROPONIN I (HIGH SENSITIVITY)    EKG: None  Radiology: No results found.   Procedures   Medications Ordered in the ED  diphenhydrAMINE  (BENADRYL ) injection 25 mg (has no administration in time range)  acetaminophen  (TYLENOL ) tablet 1,000 mg (has no administration in time range)  metoCLOPramide  (REGLAN ) injection 10 mg (has no administration in time range)  sodium chloride  0.9 % bolus 500 mL (has no administration in time range)                                    Medical Decision Making Patient presents with complaint of headache.  She also reports associated syncopal event.  Presentation is overall most suggestive of migraine headache with associated vasovagal syncope.  Obtained workup including CT angio of the head and neck is without acute abnormality.  Screening labs obtained are without significant acute abnormality.  Patient treated for migraine headache.  She feels much improved on reevaluation.  Her headache is resolved.  She is neurologically intact.  She is ambulating around the ED prior to discharge without any difficulty.  Both she and her husband at bedside understand need for close outpatient follow-up.  Strict return precautions given and understood.  Amount and/or Complexity of Data Reviewed Labs: ordered. Radiology: ordered.  Risk OTC drugs. Prescription drug management.        Final diagnoses:  Nonintractable headache, unspecified chronicity pattern, unspecified headache type  Syncope, unspecified syncope type    ED Discharge Orders     None          Laurice Maude BROCKS, MD 12/11/23 1525

## 2023-12-11 NOTE — ED Notes (Signed)
 PT aware a urine sample is needed. PT to use call light when she can go.
